# Patient Record
Sex: Female | Born: 1935 | Race: White | Hispanic: No | Smoking: Never smoker
Health system: Southern US, Community
[De-identification: ages and names within clinical notes are randomized; demographics above are authoritative.]

## PROBLEM LIST (undated history)

## (undated) DIAGNOSIS — R011 Cardiac murmur, unspecified: Secondary | ICD-10-CM

## (undated) DIAGNOSIS — M1990 Unspecified osteoarthritis, unspecified site: Secondary | ICD-10-CM

## (undated) DIAGNOSIS — D649 Anemia, unspecified: Secondary | ICD-10-CM

## (undated) DIAGNOSIS — N200 Calculus of kidney: Secondary | ICD-10-CM

## (undated) DIAGNOSIS — Z9289 Personal history of other medical treatment: Secondary | ICD-10-CM

## (undated) DIAGNOSIS — G43909 Migraine, unspecified, not intractable, without status migrainosus: Secondary | ICD-10-CM

## (undated) HISTORY — PX: BACK SURGERY: SHX140

## (undated) HISTORY — PX: KNEE ARTHROSCOPY: SHX127

## (undated) HISTORY — PX: DILATION AND CURETTAGE OF UTERUS: SHX78

## (undated) HISTORY — PX: KIDNEY CYST REMOVAL: SHX684

---
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
## 1963-01-19 HISTORY — PX: EXCISIONAL HEMORRHOIDECTOMY: SHX1541

## 1969-01-18 HISTORY — PX: TOTAL ABDOMINAL HYSTERECTOMY: SHX209

## 1981-01-18 HISTORY — PX: CHOLECYSTECTOMY: SHX55

## 1995-01-19 HISTORY — PX: PARTIAL NEPHRECTOMY: SHX414

## 1995-01-19 HISTORY — PX: THORACIC DISC SURGERY: SHX801

## 1999-01-19 HISTORY — PX: CATARACT EXTRACTION W/ INTRAOCULAR LENS  IMPLANT, BILATERAL: SHX1307

## 1999-08-17 ENCOUNTER — Encounter: Payer: Self-pay | Admitting: Internal Medicine

## 1999-08-17 ENCOUNTER — Encounter: Admission: RE | Admit: 1999-08-17 | Discharge: 1999-08-17 | Payer: Self-pay | Admitting: Internal Medicine

## 2000-08-17 ENCOUNTER — Encounter: Payer: Self-pay | Admitting: Internal Medicine

## 2000-08-17 ENCOUNTER — Encounter: Admission: RE | Admit: 2000-08-17 | Discharge: 2000-08-17 | Payer: Self-pay | Admitting: Internal Medicine

## 2000-10-20 ENCOUNTER — Ambulatory Visit (HOSPITAL_COMMUNITY): Admission: RE | Admit: 2000-10-20 | Discharge: 2000-10-20 | Payer: Self-pay | Admitting: Gastroenterology

## 2000-10-20 ENCOUNTER — Encounter (INDEPENDENT_AMBULATORY_CARE_PROVIDER_SITE_OTHER): Payer: Self-pay | Admitting: Specialist

## 2004-06-29 ENCOUNTER — Encounter: Admission: RE | Admit: 2004-06-29 | Discharge: 2004-06-29 | Payer: Self-pay | Admitting: Internal Medicine

## 2005-02-28 ENCOUNTER — Observation Stay (HOSPITAL_COMMUNITY): Admission: EM | Admit: 2005-02-28 | Discharge: 2005-03-01 | Payer: Self-pay | Admitting: Emergency Medicine

## 2008-12-09 ENCOUNTER — Inpatient Hospital Stay (HOSPITAL_COMMUNITY): Admission: EM | Admit: 2008-12-09 | Discharge: 2008-12-16 | Payer: Self-pay | Admitting: Emergency Medicine

## 2010-04-22 LAB — CBC
HCT: 26.8 % — ABNORMAL LOW (ref 36.0–46.0)
HCT: 29.8 % — ABNORMAL LOW (ref 36.0–46.0)
HCT: 38.3 % (ref 36.0–46.0)
Hemoglobin: 13.2 g/dL (ref 12.0–15.0)
Hemoglobin: 9.1 g/dL — ABNORMAL LOW (ref 12.0–15.0)
MCHC: 34 g/dL (ref 30.0–36.0)
MCV: 96.5 fL (ref 78.0–100.0)
MCV: 97.4 fL (ref 78.0–100.0)
Platelets: 176 10*3/uL (ref 150–400)
Platelets: 185 10*3/uL (ref 150–400)
Platelets: 227 10*3/uL (ref 150–400)
RBC: 2.76 MIL/uL — ABNORMAL LOW (ref 3.87–5.11)
RBC: 3.97 MIL/uL (ref 3.87–5.11)
RDW: 13.8 % (ref 11.5–15.5)
WBC: 10.2 10*3/uL (ref 4.0–10.5)
WBC: 17.2 10*3/uL — ABNORMAL HIGH (ref 4.0–10.5)
WBC: 8.8 10*3/uL (ref 4.0–10.5)

## 2010-04-22 LAB — URINALYSIS, ROUTINE W REFLEX MICROSCOPIC
Nitrite: NEGATIVE
Specific Gravity, Urine: 1.018 (ref 1.005–1.030)
Urobilinogen, UA: 1 mg/dL (ref 0.0–1.0)
pH: 6 (ref 5.0–8.0)

## 2010-04-22 LAB — DIFFERENTIAL
Basophils Absolute: 0 10*3/uL (ref 0.0–0.1)
Basophils Relative: 0 % (ref 0–1)
Monocytes Absolute: 1 10*3/uL (ref 0.1–1.0)
Neutro Abs: 15.3 10*3/uL — ABNORMAL HIGH (ref 1.7–7.7)

## 2010-04-22 LAB — COMPREHENSIVE METABOLIC PANEL
Albumin: 3.4 g/dL — ABNORMAL LOW (ref 3.5–5.2)
Alkaline Phosphatase: 83 U/L (ref 39–117)
BUN: 19 mg/dL (ref 6–23)
CO2: 26 mEq/L (ref 19–32)
Chloride: 105 mEq/L (ref 96–112)
GFR calc non Af Amer: >60 mL/min (ref >60)
Glucose, Bld: 113 mg/dL — ABNORMAL HIGH (ref 70–99)
Potassium: 4 mEq/L (ref 3.5–5.1)
Total Bilirubin: 0.6 mg/dL (ref 0.3–1.2)

## 2010-04-22 LAB — URINE MICROSCOPIC-ADD ON

## 2010-04-22 LAB — BASIC METABOLIC PANEL
BUN: 18 mg/dL (ref 6–23)
Creatinine, Ser: 0.77 mg/dL (ref 0.4–1.2)
GFR calc Af Amer: >60 mL/min (ref >60)
GFR calc non Af Amer: >60 mL/min (ref >60)
Potassium: 4 mEq/L (ref 3.5–5.1)

## 2010-04-22 LAB — URINE CULTURE

## 2010-06-05 NOTE — H&P (Signed)
NAMEAILED, DEFIBAUGH NO.:  [id number]   MEDICAL RECORD NO.:  [id number]          PATIENT TYPE:  EMS   LOCATION:  MAJO                         FACILITY:  [facility]   PHYSICIAN:  Thora Lance, M.D.  DATE OF BIRTH:  1919-08-25   DATE OF ADMISSION:  02/28/2005  DATE OF DISCHARGE:                                HISTORY & PHYSICAL   CHIEF COMPLAINT:  Chest pain.   HISTORY OF PRESENT ILLNESS:  This is an 77 year old white female who  presents with chest pain.  Today after church she sat down to lunch.  After  taking two bites she had the sudden onset of a severe 10/10 crushing  retrosternal chest pain which radiated to both shoulders and arms.  It was  associated with an inability to get a deep breath, nausea, but no vomiting.  She tried to drink some coffee during this time, but had a hard time getting  it down, felt like she was going to choke, but did not have any aspiration  symptoms.  She took aspirin x1 dose and then nitroglycerin sublingual x2  with no relief.  She called EMT and was given another nitroglycerin by EMT  again with no relief.  In the ER she was given a combination of morphine,  lorazepam, and a GI cocktail with improvement in her pain and now only  complaints of a mild pain in her left upper chest.  She denies any recent  exertional chest pain, shortness of breath, cough, indigestion, heartburn,  dysphagia, reflux of acid up into her throat, abdominal pain.  She had two  very similar pain episodes in the spring of 2006.  At least one of these  occurred while she was sleeping.  She was referred to Dr. Armanda Magic who  did the 2-D echocardiogram and then adenosine Cardiolite in June of last  year and she was told afterwards she had a stiff left heart.  She was placed  on metoprolol by Dr. Earl Gala but this caused a rash and diarrhea and was  discontinued in September of 2006.  She has had no recent exertional chest  pain, but does have a chronic  feeling of mild shortness of breath when she  walks.   PAST MEDICAL HISTORY:  1.  DJD of the knees.  2.  Back pain.   PAST SURGICAL HISTORY:  1.  Arthroscopic surgery of both knees.  2.  Left partial nephrectomy, Dr. Logan Bores.  3.  Right renal cyst drainage, Dr. Logan Bores.  4.  Back surgery.  5.  Cholecystectomy.  6.  TAH/USO.   ALLERGIES:  PENICILLIN, CELEBREX, BEXTRA, SULFA, METOPROLOL.   CURRENT MEDICATIONS:  Aspirin 325 mg p.o. daily.   FAMILY HISTORY:  Sister dementia.  Brother CAD in 63s.  Brother valvular  heart disease.  Brother possible septal defect and pacemaker, died age 98.   SOCIAL HISTORY:  Widowed 22 years.  Has four sons, one of whom lives next  door.  Nonsmoker.  Nondrinker.   REVIEW OF SYSTEMS:  As above.   PHYSICAL EXAMINATION:  GENERAL:  She appears  comfortable lying supine in the  ER.  VITAL SIGNS:  Blood pressure 166/74, heart rate 54, respirations 20, oxygen  saturation 97% on room air.  Temperature 97.  HEENT:  Eyes:  Pupils equal and respond to light.  Extraocular movements are  intact.  Ears:  TMs clear.  Oropharynx is clear.  NECK:  Supple.  No carotid bruits.  LUNGS:  Dry crackles at the bases.  HEART:  Regular rate and rhythm without murmurs, rubs, or gallops.  ABDOMEN:  Soft, nontender.  No mass or hepatosplenomegaly.  Normal bowel  sounds.  BREASTS:  Deferred.  PELVIC:  Deferred.  RECTAL:  Deferred.  EXTREMITIES:  No edema.  Normal bilateral femoral, dorsalis pedis, and  posterior tibial pulses.  NEUROLOGIC:  Nonfocal.   LABORATORIES:  Sodium 138, potassium 3.9, chloride 106, bicarbonate 19,  creatinine 0.8, glucose 143.  CK 1.6 and 2.5, troponin I less than 0.05 x2.  D-dimer 0.37 which is normal.   Chest x-ray shows a large hiatal hernia, chronic increased markings at the  bases.  EKG:  Sinus bradycardia and nonspecific T-wave changes.   ASSESSMENT:  Chest pain.  Rule out myocardial infarction.  Rule out ischemic  cause.  I suspect  esophageal reflux, esophageal spasm, or chest wall pain is  more likely.   PLAN:  Admit to telemetry.  Rule out MI.  Aspirin.  PPI.  Consider cardiac  catheterization.           ______________________________  Thora Lance, M.D.     JJG/MEDQ  D:  02/28/2005  T:  03/01/2005  Job:  [id number]

## 2010-06-05 NOTE — Discharge Summary (Signed)
Erika [name], Erika [name] NO.:  [id number]   MEDICAL RECORD NO.:  [id number]          PATIENT TYPE:  INP   LOCATION:  5524                         FACILITY:  [facility]   PHYSICIAN:  Theressa Millard, M.D.    DATE OF BIRTH:  21-Apr-1919   DATE OF ADMISSION:  02/28/2005  DATE OF DISCHARGE:  03/01/2005                                 DISCHARGE SUMMARY   ADMITTING DIAGNOSES:  Chest pain.   DISCHARGE DIAGNOSES:  Chest pain, probable esophageal spasm.   The patient is an 77 year old white female who was admitted with an episode  of crushing substernal chest pain that started when she was eating.  During  the pain she decided she was unable to swallow coffee but she also had a  feeling of shortness of breath and as though her breathing was cut off and  she decided to take a deep breath.  She came to the emergency department  after approximately an hour.  At home she had actually tried three  nitroglycerin without relief.  She was given more nitroglycerin and aspirin  en route and that did not relieve her.  In the emergency room she was given  morphine, a GI cocktail, and oxygen and she fell asleep.  When she awoke the  pain was pretty much gone.   HOSPITAL COURSE:  The patient was admitted on the basis of serial EKGs and  CKs and myocardial infarction was ruled out.  She had no further chest  discomfort even when she ate and when she ambulated.  She was therefore  discharged in improved condition.   In terms of etiology it is noted that her D-dimer was normal which  effectively rules out pulmonary embolism.  CKs were negative and EKGs were  fine which rules out a myocardial infarction.  She had a stress Cardiolite  test in June of 2006 after a similar episode of discomfort which would make  prolonged angina unlikely.  Finally, she does describe some dysphagia during  the episode, therefore making [place] think that the patient actually suffers  from esophageal spasm.  It is a little  unusual that nitroglycerin did not  relieve her discomfort but no other etiology is forthcoming at this point.   The patient will be discharged on Protonix 40 mg daily and aspirin 325 mg  daily.  She will call to make an appointment to see me in one month.  Her  diet is not restricted and her activities are not restricted.  She is  advised to call if she has recurring symptoms.      Theressa Millard, M.D.  Electronically Signed     JO/MEDQ  D:  03/01/2005  T:  03/02/2005  Job:  [id number]

## 2010-06-05 NOTE — Procedures (Signed)
[name]  Patient:    Erika [name], CRUTCHLEY Visit Number: [id number] MRN: [id number]          Service Type: Attending:  Verlin Grills, M.D. Dictated by:   Verlin Grills, M.D. Proc. Date: 10/20/00   CC:         [name], MD                           Procedure Report  PROCEDURE PERFORMED:  Esophagogastroduodenoscopy, small bowel biopsy and colonoscopy.  DATE OF BIRTH:  07-31-1919  ENDOSCOPIST:  Verlin Grills, M.D.  INDICATIONS FOR PROCEDURE:  The patient is a 77 year old female with unexplained iron deficiency anemia.  I discussed with [name] the complications associated with colonoscopy and polypectomy including a 15 per 1000 risk of bleeding and 4 per 1000 risk of colon perforation requiring surgical repair.  PREMEDICATION:  Versed 5 mg, fentanyl 50 mcg.  INSTRUMENT USED:  Olympus pediatric colonoscope.  DESCRIPTION OF PROCEDURE:  Esophagogastroduodenoscopy with small bowel biopsies.  After obtaining informed consent, [name] was placed in the left lateral decubitus position.  I administered intravenous fentanyl and intravenous versed to achieve conscious sedation for the procedure.  The patients blood pressure, oxygen saturations and cardiac rhythm were monitored throughout the procedure and documented in the medical record.  The Olympus pediatric colonoscope was passed through the posterior hypopharynx into the proximal esophagus without difficulty.  The hypopharynx, larynx and vocal cords appeared normal.  Esophagoscopy:  The proximal, mid and lower segments of the esophagus appeared normal.  Gastroscopy:  [name] has a large hiatal hernia.  There are no erosions at the diaphragmatic hiatus.  Retroflex view of the gastric cardia and fundus was normal.  The gastric body, antrum and pylorus appeared normal.  Duodenoscopy:  The duodenal bulb, mid-duodenum, distal duodenum and proximal jejunum  appeared normal.  Five biopsies were taken from the second--third portions of the duodenum to rule out celiac sprue.  ASSESSMENT: 1. Large hiatal hernia; otherwise normal esophagogastroduodenoscopy.  Small    bowel biopsies to rule out villous atrophy pending.  Proctocolonoscopy to the cecum:  Anal inspection normal.  Digital rectal examination normal.  The Olympus pediatric video colonoscope was introduced into the rectum and easily advanced to the cecum.  Colonic preparation for the exam today was satisfactory.  Rectum:  Normal.  Sigmoid colon and descending colon:  Extensive left colonic diverticulosis.  Splenic flexure:  Normal.  Transverse colon:  Normal.  Hepatic flexure:  Normal.  Ascending colon:  Normal.  Cecum and ileocecal valve:  Normal.  ASSESSMENT: 1. Extensive left colonic diverticulosis; otherwise normal proctocolonoscopy    to the cecum.  No endoscopic evidence for the presence of colorectal neoplasia. Dictated by:   Verlin Grills, M.D. Attending:  Verlin Grills, M.D. DD:  10/20/00 TD:  10/20/00 Job: [id number]

## 2013-10-09 ENCOUNTER — Emergency Department (HOSPITAL_COMMUNITY): Payer: Medicare Other

## 2013-10-09 ENCOUNTER — Encounter (HOSPITAL_COMMUNITY): Payer: Self-pay | Admitting: Emergency Medicine

## 2013-10-09 ENCOUNTER — Inpatient Hospital Stay (HOSPITAL_COMMUNITY)
Admission: EM | Admit: 2013-10-09 | Discharge: 2013-10-11 | DRG: 808 | Disposition: A | Discharge status: Home Health | Payer: Medicare Other | Attending: Internal Medicine | Admitting: Internal Medicine

## 2013-10-09 DIAGNOSIS — Z9849 Cataract extraction status, unspecified eye: Secondary | ICD-10-CM

## 2013-10-09 DIAGNOSIS — D539 Nutritional anemia, unspecified: Secondary | ICD-10-CM | POA: Diagnosis present

## 2013-10-09 DIAGNOSIS — Z9289 Personal history of other medical treatment: Secondary | ICD-10-CM

## 2013-10-09 DIAGNOSIS — R0602 Shortness of breath: Secondary | ICD-10-CM | POA: Diagnosis present

## 2013-10-09 DIAGNOSIS — Z79899 Other long term (current) drug therapy: Secondary | ICD-10-CM | POA: Diagnosis not present

## 2013-10-09 DIAGNOSIS — Z961 Presence of intraocular lens: Secondary | ICD-10-CM

## 2013-10-09 DIAGNOSIS — R0609 Other forms of dyspnea: Secondary | ICD-10-CM | POA: Diagnosis present

## 2013-10-09 DIAGNOSIS — D61818 Other pancytopenia: Secondary | ICD-10-CM | POA: Diagnosis present

## 2013-10-09 DIAGNOSIS — I509 Heart failure, unspecified: Secondary | ICD-10-CM | POA: Diagnosis present

## 2013-10-09 DIAGNOSIS — I5033 Acute on chronic diastolic (congestive) heart failure: Secondary | ICD-10-CM | POA: Diagnosis present

## 2013-10-09 DIAGNOSIS — D649 Anemia, unspecified: Secondary | ICD-10-CM | POA: Diagnosis present

## 2013-10-09 DIAGNOSIS — N390 Urinary tract infection, site not specified: Secondary | ICD-10-CM | POA: Diagnosis present

## 2013-10-09 DIAGNOSIS — R0989 Other specified symptoms and signs involving the circulatory and respiratory systems: Secondary | ICD-10-CM

## 2013-10-09 HISTORY — DX: Unspecified osteoarthritis, unspecified site: M19.90

## 2013-10-09 HISTORY — DX: Calculus of kidney: N20.0

## 2013-10-09 HISTORY — DX: Personal history of other medical treatment: Z92.89

## 2013-10-09 HISTORY — DX: Anemia, unspecified: D64.9

## 2013-10-09 HISTORY — DX: Migraine, unspecified, not intractable, without status migrainosus: G43.909

## 2013-10-09 HISTORY — DX: Cardiac murmur, unspecified: R01.1

## 2013-10-09 LAB — I-STAT CHEM 8, ED
BUN: 22 mg/dL (ref 6–23)
CALCIUM ION: 1.17 mmol/L (ref 1.13–1.30)
CHLORIDE: 101 meq/L (ref 96–112)
Creatinine, Ser: 0.9 mg/dL (ref 0.50–1.10)
GLUCOSE: 113 mg/dL — AB (ref 70–99)
HEMATOCRIT: 19 % — AB (ref 36.0–46.0)
HEMOGLOBIN: 6.5 g/dL — AB (ref 12.0–15.0)
Potassium: 4.4 mEq/L (ref 3.7–5.3)
Sodium: 136 mEq/L — ABNORMAL LOW (ref 137–147)
TCO2: 22 mmol/L (ref 0–100)

## 2013-10-09 LAB — PRO B NATRIURETIC PEPTIDE: Pro B Natriuretic peptide (BNP): 1194 pg/mL — ABNORMAL HIGH (ref 0–450)

## 2013-10-09 LAB — CBC WITH DIFFERENTIAL/PLATELET
BASOS ABS: 0 10*3/uL (ref 0.0–0.1)
BASOS PCT: 0 % (ref 0–1)
EOS ABS: 0 10*3/uL (ref 0.0–0.7)
Eosinophils Relative: 1 % (ref 0–5)
HCT: 17.6 % — ABNORMAL LOW (ref 36.0–46.0)
HEMOGLOBIN: 5.9 g/dL — AB (ref 12.0–15.0)
LYMPHS PCT: 59 % — AB (ref 12–46)
Lymphs Abs: 1.2 10*3/uL (ref 0.7–4.0)
MCH: 39.9 pg — AB (ref 26.0–34.0)
MCHC: 33.5 g/dL (ref 30.0–36.0)
MCV: 118.9 fL — ABNORMAL HIGH (ref 78.0–100.0)
MONO ABS: 0.2 10*3/uL (ref 0.1–1.0)
Monocytes Relative: 8 % (ref 3–12)
NEUTROS ABS: 0.6 10*3/uL — AB (ref 1.7–7.7)
NEUTROS PCT: 32 % — AB (ref 43–77)
Platelets: 150 10*3/uL (ref 150–400)
RBC: 1.48 MIL/uL — ABNORMAL LOW (ref 3.87–5.11)
RDW: 16.7 % — AB (ref 11.5–15.5)
WBC: 2 10*3/uL — ABNORMAL LOW (ref 4.0–10.5)

## 2013-10-09 LAB — URINALYSIS, ROUTINE W REFLEX MICROSCOPIC
BILIRUBIN URINE: NEGATIVE
Glucose, UA: NEGATIVE mg/dL
Hgb urine dipstick: NEGATIVE
KETONES UR: NEGATIVE mg/dL
NITRITE: NEGATIVE
PH: 7 (ref 5.0–8.0)
PROTEIN: NEGATIVE mg/dL
Specific Gravity, Urine: 1.014 (ref 1.005–1.030)
UROBILINOGEN UA: 0.2 mg/dL (ref 0.0–1.0)

## 2013-10-09 LAB — POC OCCULT BLOOD, ED: FECAL OCCULT BLD: NEGATIVE

## 2013-10-09 LAB — I-STAT TROPONIN, ED: Troponin i, poc: 0.01 ng/mL (ref 0.00–0.08)

## 2013-10-09 LAB — URINE MICROSCOPIC-ADD ON

## 2013-10-09 LAB — PREPARE RBC (CROSSMATCH)

## 2013-10-09 LAB — ABO/RH: ABO/RH(D): A POS

## 2013-10-09 LAB — D-DIMER, QUANTITATIVE (NOT AT ARMC): D DIMER QUANT: 1.61 ug{FEU}/mL — AB (ref 0.00–0.48)

## 2013-10-09 MED ORDER — ACETAMINOPHEN 325 MG PO TABS: 650.0000 mg | ORAL_TABLET | Freq: Four times a day (QID) | ORAL | Status: DC | PRN

## 2013-10-09 MED ORDER — FUROSEMIDE 10 MG/ML IJ SOLN
20.0000 mg | Freq: Once | INTRAMUSCULAR | Status: AC
  Administered 2013-10-09: 20 mg via INTRAVENOUS
  Filled 2013-10-09: qty 2

## 2013-10-09 MED ORDER — SODIUM CHLORIDE 0.9 % IJ SOLN
3.0000 mL | Freq: Two times a day (BID) | INTRAMUSCULAR | Status: DC
  Administered 2013-10-09 – 2013-10-10 (×2): 3 mL via INTRAVENOUS

## 2013-10-09 MED ORDER — SODIUM CHLORIDE 0.9 % IV SOLN
Freq: Once | INTRAVENOUS | Status: AC
  Administered 2013-10-09: 15:00:00 via INTRAVENOUS

## 2013-10-09 MED ORDER — ONDANSETRON HCL 4 MG/2ML IJ SOLN: 4.0000 mg | Freq: Four times a day (QID) | INTRAMUSCULAR | Status: DC | PRN

## 2013-10-09 MED ORDER — DEXTROSE 5 % IV SOLN
1.0000 g | INTRAVENOUS | Status: DC
  Administered 2013-10-10 (×2): 1 g via INTRAVENOUS
  Filled 2013-10-09 (×3): qty 10

## 2013-10-09 MED ORDER — FUROSEMIDE 10 MG/ML IJ SOLN
20.0000 mg | Freq: Two times a day (BID) | INTRAMUSCULAR | Status: DC
  Administered 2013-10-09 – 2013-10-10 (×2): 20 mg via INTRAVENOUS
  Filled 2013-10-09 (×4): qty 2

## 2013-10-09 MED ORDER — ACETAMINOPHEN 650 MG RE SUPP: 650.0000 mg | Freq: Four times a day (QID) | RECTAL | Status: DC | PRN

## 2013-10-09 MED ORDER — LOSARTAN POTASSIUM 50 MG PO TABS
50.0000 mg | ORAL_TABLET | Freq: Every day | ORAL | Status: DC
  Administered 2013-10-09 – 2013-10-11 (×3): 50 mg via ORAL
  Filled 2013-10-09 (×3): qty 1

## 2013-10-09 MED ORDER — VITAMIN D3 25 MCG (1000 UNIT) PO TABS
1000.0000 [IU] | ORAL_TABLET | Freq: Every day | ORAL | Status: DC
  Administered 2013-10-10 – 2013-10-11 (×2): 1000 [IU] via ORAL
  Filled 2013-10-09 (×2): qty 1

## 2013-10-09 MED ORDER — VITAMIN D-3 25 MCG (1000 UT) PO CAPS: 1.0000 | ORAL_CAPSULE | Freq: Every day | ORAL | Status: DC

## 2013-10-09 MED ORDER — ASPIRIN 81 MG PO CHEW
324.0000 mg | CHEWABLE_TABLET | Freq: Once | ORAL | Status: AC
  Administered 2013-10-09: 324 mg via ORAL
  Filled 2013-10-09: qty 4

## 2013-10-09 MED ORDER — ONDANSETRON HCL 4 MG PO TABS: 4.0000 mg | ORAL_TABLET | Freq: Four times a day (QID) | ORAL | Status: DC | PRN

## 2013-10-09 MED ORDER — DEXTROSE 5 % IV SOLN: 1.0000 g | INTRAVENOUS | Status: DC

## 2013-10-09 MED ORDER — ADULT MULTIVITAMIN W/MINERALS CH
1.0000 | ORAL_TABLET | Freq: Every day | ORAL | Status: DC
  Administered 2013-10-09 – 2013-10-11 (×3): 1 via ORAL
  Filled 2013-10-09 (×3): qty 1

## 2013-10-09 NOTE — ED Notes (Signed)
Pt. Reports "feeling ill" since august after pneumonia shot. States past 2 weeks has had SOB, is being treated for a UTI. Sent here from PCP due to irregular heart beat. Denies chest pain. Alert and oriented x4.

## 2013-10-09 NOTE — H&P (Signed)
[facility] History and Physical  Erika [name]:[id number] DOB: 31-Mar-1919 DOA: 10/09/2013  Referring physician:  PCP: Valaria Good, MD  Specialists:   Chief Complaint: SOB, DOE  HPI: Erika [name] is a 80 y.o. female with no significant medical history had intermittent SOB, DOE for 2 weeks associated with L shoulder, muscle pain. Pt also developed dysuria and Dx with UTI, started on bactrim two days prior to admission;  Since then , she reports worsening SOB, DOE, associated with PND, orthopnea and found to have severe anemia with Hg of 5.9; Pt denies any acut e bleeding, no nausea, vomiting or diarrhea, no hematuria, no hematochezia, no hemoptysis;   -Hospitalist called for evaluation symptomatic anemia   Review of Systems: The patient denies anorexia, fever, weight loss,, vision loss, decreased hearing, hoarseness, chest pain, syncope, dyspnea on exertion, peripheral edema, balance deficits, hemoptysis, abdominal pain, melena, hematochezia, severe indigestion/heartburn, hematuria, incontinence, genital sores, muscle weakness, suspicious skin lesions, transient blindness, difficulty walking, depression, unusual weight change, abnormal bleeding, enlarged lymph nodes, angioedema, and breast masses.    History reviewed. No pertinent past medical history. No h/o CAD, no CHF History reviewed. No pertinent past surgical history. Denies tobacco use, no etoh use  Social History:  reports that she has never smoked. She does not have any smokeless tobacco history on file. She reports that she does not drink alcohol or use illicit drugs. Home  where does patient live--home, ALF, SNF? and with whom if at home? Yes;  Can patient participate in ADLs?  Allergies  Allergen Reactions  . Penicillins Other (See Comments)    unknown  . Sulfa Antibiotics Other (See Comments)    unknown    History reviewed. No pertinent family history. denies h/o CAD (be sure to complete)  Prior to  Admission medications   Medication Sig Start Date End Date Taking? Authorizing Provider  Ascorbic Acid (VITAMIN C) 1000 MG tablet Take 1,000 mg by mouth daily.   Yes Historical Provider, MD  b complex vitamins tablet Take 1 tablet by mouth daily.   Yes Historical Provider, MD  Cholecalciferol (VITAMIN D-3) 1000 UNITS CAPS Take 1 capsule by mouth daily.   Yes Historical Provider, MD  losartan (COZAAR) 50 MG tablet Take 50 mg by mouth daily.  10/05/13  Yes Historical Provider, MD  Multiple Vitamin (MULTIVITAMIN WITH MINERALS) TABS tablet Take 1 tablet by mouth daily.   Yes Historical Provider, MD  sulfamethoxazole-trimethoprim (BACTRIM DS) 800-160 MG per tablet Take 1 tablet by mouth 2 (two) times daily.  10/07/13  Yes Historical Provider, MD   Physical Exam: Filed Vitals:   10/09/13 1430  BP: 132/61  Pulse: 89  Temp:   Resp: 18     General:  aler t  Eyes: eom-i  ENT: no oral ulcers   Neck: supple mild JVD   Cardiovascular: D1,S9 systolic mr   Respiratory: few crackle sin LL  Abdomen: soft, mild tender in the bladder area, no rebound; + BS   Skin: no rash   Musculoskeletal: mild edema   Psychiatric: no hallucinations   Neurologic: CN 2-12 intact, motor 5/5 BL  Labs on Admission:  Basic Metabolic Panel:  Recent Labs Lab 10/09/13 1315  NA 136*  K 4.4  CL 101  GLUCOSE 113*  BUN 22  CREATININE 0.90   Liver Function Tests: No results found for this basename: AST, ALT, ALKPHOS, BILITOT, PROT, ALBUMIN,  in the last 168 hours No results found for this basename: LIPASE, AMYLASE,  in the last 168 hours No results found for this basename: AMMONIA,  in the last 168 hours CBC:  Recent Labs Lab 10/09/13 1249 10/09/13 1315  WBC 2.0*  --   NEUTROABS 0.6*  --   HGB 5.9* 6.5*  HCT 17.6* 19.0*  MCV 118.9*  --   PLT 150  --    Cardiac Enzymes: No results found for this basename: CKTOTAL, CKMB, CKMBINDEX, TROPONINI,  in the last 168 hours  BNP (last 3  results)  Recent Labs  10/09/13 1251  PROBNP 1194.0*   CBG: No results found for this basename: GLUCAP,  in the last 168 hours  Radiological Exams on Admission: Ct Abdomen Pelvis Wo Contrast  10/09/2013   CLINICAL DATA:  Epigastric pain.  EXAM: CT ABDOMEN AND PELVIS WITHOUT CONTRAST  TECHNIQUE: Multidetector CT imaging of the abdomen and pelvis was performed following the standard protocol without IV contrast.  COMPARISON:  None.  FINDINGS: Multilevel degenerative disc disease is noted in the lumbar spine. Old L1 compression fracture is noted. Visualized lung bases appear normal. Large hiatal hernia is noted.  Left hepatic cyst is noted. Status post cholecystectomy. The spleen and pancreas appear normal. Simple right renal cysts are noted. Mild right renal atrophy is noted. Cortical scarring of upper pole of left kidney is noted. No hydronephrosis or renal obstruction is noted. Atherosclerotic calcifications of abdominal aorta are noted without aneurysm formation. There is no evidence of bowel obstruction. Stool is noted in the right colon. Minimal sigmoid diverticulosis is noted without inflammation urinary bladder appears normal. 3.9 cm right ovarian cyst is noted. Simple right renal cysts are noted.  IMPRESSION: Large hiatal hernia is noted.  Bilateral nephrolithiasis is noted without hydronephrosis or renal obstruction.  Mild sigmoid diverticulosis is noted without inflammation.  3.9 cm probable simple right ovarian cyst is noted. Followup ultrasound in 1 year is recommended to ensure stability.   Electronically Signed   By: Sabino Dick M.D.   On: 10/09/2013 14:04   Dg Chest 2 View  10/09/2013   CLINICAL DATA:  Shortness of breath and dizziness  EXAM: CHEST  2 VIEW  COMPARISON:  February 28, 2005  FINDINGS: There is no edema or consolidation. Heart is borderline enlarged with pulmonary vascularity within normal limits. Most of the stomach is above the diaphragm. No pneumothorax. There is evidence  of prior fracture involving the proximal right humerus.  IMPRESSION: Most of the stomach is above the diaphragm. No edema or consolidation. Heart borderline enlarged. Prior trauma proximal right humerus.   Electronically Signed   By: Lowella Grip M.D.   On: 10/09/2013 13:51    EKG: Independently reviewed.   Assessment/Plan Principal Problem:   Symptomatic anemia Active Problems:   SOB (shortness of breath)   DOE (dyspnea on exertion)   UTI (lower urinary tract infection)   80 y.o. female with no significant medical history had intermittent SOB, DOE for 2 weeks, recent  Dx with UTI, on bactrim found to have severe anemia with Hg of 5.9, with possible CHF   1. Acute symptomatic anemia of unclear etiology; no s/s of acute bleeding; occult blood test negative  -TF sing 2 units in ED; monitor Hg; TF prn  -? Underlying MDS vs acute side effect of bactrim; no recent CBC to compare  -check LDH, LFTs, haptoglobin r/o hemolysis, high MCV; check iron profile, B12,folate; may need BM biopsy; f/u peripheral smear  2. Acute CHF likely due to Anemia;  -start gentle IV diuresis; obtain echo;  f/u trop, ECG; daily weight, I/O';  3. Recent UTI, d/c bactrim; start IV ceftriaxone; f/u cultures   DVT prophylaxis; hold heparin until r/o bleeding; cont SCD    None;  if consultant consulted, please document name and whether formally or informally consulted  Code Status: full (must indicate code status--if unknown or must be presumed, indicate so) Family Communication:  D/w patient, his sons at the bedside (indicate person spoken with, if applicable, with phone number if by telephone) Disposition Plan: home pend clinical improvement  (indicate anticipated LOS)  Time spent: >35 minutes   [place] [place] [facility] Pager [phone]  If 7PM-7AM, please contact night-coverage [email or web address] Password [facility] 10/09/2013, 4:02 PM

## 2013-10-09 NOTE — Progress Notes (Signed)
NURSING PROGRESS NOTE  Erika [name] [id number] Admission Data: 10/09/2013 5:52 PM Attending Provider: Kinnie Feil, MD MYT:[id number],LIDCV  M, MD Code Status: full   Erika [name] is a 80 y.o. female patient admitted from ED:  -No acute distress noted.  -No complaints of shortness of breath.  -No complaints of chest pain.   Cardiac Monitoring: Box #[id number] in place. Cardiac monitor yields:normal sinus rhythm.  Blood pressure 154/71, pulse 92, temperature 98 F (36.7 C), temperature source Oral, resp. rate 16, height 5\' 3"  (1.6 m), weight 70.761 kg (156 lb), SpO2 96.00%.   IV Fluids:  IV in place, occlusive dsg intact without redness, IV cath antecubital right and Left, condition patent and no redness and left, condition patent and no redness NSL.   Allergies:  Sulfa antibiotics and Penicillins  Past Medical History:   has no past medical history on file.  Past Surgical History:   has no past surgical history on file.  Social History:   reports that she has never smoked. She does not have any smokeless tobacco history on file. She reports that she does not drink alcohol or use illicit drugs.  Skin: pale  Patient/Family orientated to room. Information packet given to patient/family. Admission inpatient armband information verified with patient/family to include name and date of birth and placed on patient arm. Side rails up x 2, fall assessment and education completed with patient/family. Patient/family able to verbalize understanding of risk associated with falls and verbalized understanding to call for assistance before getting out of bed. Call light within reach. Patient/family able to voice and demonstrate understanding of unit orientation instructions.    Will continue to evaluate and treat per MD orders.

## 2013-10-09 NOTE — Progress Notes (Signed)
rn called for report

## 2013-10-09 NOTE — Progress Notes (Addendum)
ANTIBIOTIC CONSULT NOTE - INITIAL  Pharmacy Consult for Ceftriaxone Indication: UTI  Allergies  Allergen Reactions  . Penicillins Other (See Comments)    unknown  . Sulfa Antibiotics Other (See Comments)    unknown    Patient Measurements: Actual Body Weight: 70.8 kg   Vital Signs: Temp: 99.1 F (37.3 C) (09/22 1225) Temp src: Oral (09/22 1225) BP: 132/61 mmHg (09/22 1430) Pulse Rate: 89 (09/22 1430) Intake/Output from previous day:   Intake/Output from this shift:    Labs:  Recent Labs  10/09/13 1249 10/09/13 1315  WBC 2.0*  --   HGB 5.9* 6.5*  PLT 150  --   CREATININE  --  0.90   CrCl is unknown because there is no height on file for the current visit. No results found for this basename: VANCOTROUGH, VANCOPEAK, VANCORANDOM, GENTTROUGH, GENTPEAK, GENTRANDOM, TOBRATROUGH, TOBRAPEAK, TOBRARND, AMIKACINPEAK, AMIKACINTROU, AMIKACIN,  in the last 72 hours   Microbiology: No results found for this or any previous visit (from the past 720 hour(s)).  Medical History: History reviewed. No pertinent past medical history.  Medications:   (Not in a hospital admission) Scheduled:  . furosemide  20 mg Intravenous Once   Infusions:   Assessment: 80yo female presents with dysuria and diagnosis of UTI 2 days ago, which she was started on Bactrim. Pharmacy is consulted to dose ceftriaxone for UTI. Pt is afebrile, WBC 2.0, sCr 0.90. UA reveals trace leukocytes, 3-6 WBC, and rare bacteria.  Pt has allergy to PCNs that she describes as a rash developed towards the latter stages of a 6 week treatment course in 1959.  Goal of Therapy:  Resolution of infection  Plan:  Start ceftriaxone 1g IV q24h Follow up culture results Pharmacy will sign off for now. Please re-consult if needed.  Andrey Cota. Diona Foley, PharmD Clinical Pharmacist Pager [phone] 10/09/2013,4:44 PM

## 2013-10-09 NOTE — ED Notes (Signed)
Blood bank informed this RN that blood is ready for transfusion

## 2013-10-09 NOTE — ED Notes (Signed)
Pt c/o irregular HR and SOB x several days; pt sts not felt right since getting flu shot in August; pt sent by PCP for further eval

## 2013-10-09 NOTE — ED Notes (Signed)
Chem 8 results given to Dr. Lenna Sciara

## 2013-10-09 NOTE — ED Notes (Signed)
Amanda Cockayne, MD with rectal examination

## 2013-10-09 NOTE — ED Provider Notes (Signed)
CSN: [id number]     Arrival date & time 10/09/13  1121 History   First MD Initiated Contact with Patient 10/09/13 1154     Chief Complaint  Patient presents with  . Irregular Heart Beat  . Shortness of Breath     (Consider location/radiation/quality/duration/timing/severity/associated sxs/prior Treatment) HPI Point of left anterior chest pain, pleuritic in nature intermittent onset 2 days ago. Patient can also complains of generalized weakness and shortness of breath. She denies nausea vomiting. She was seen at her primary care physician's office this morning, sent here for further evaluation and possible CHF History reviewed. No pertinent past medical history. past medical history negative History reviewed. No pertinent past surgical history. History reviewed. No pertinent family history. History  Substance Use Topics  . Smoking status: Never Smoker   . Smokeless tobacco: Not on file  . Alcohol Use: No   OB History   Grav Para Term Preterm Abortions TAB SAB Ect Mult Living                 Review of Systems  Constitutional: Negative.   HENT: Negative.   Respiratory: Positive for shortness of breath.   Cardiovascular: Positive for chest pain.  Gastrointestinal: Negative.   Genitourinary: Positive for dysuria.  Musculoskeletal: Negative.   Skin: Negative.   Neurological: Negative.   Psychiatric/Behavioral: Negative.   All other systems reviewed and are negative.     Allergies  Penicillins  Home Medications   Prior to Admission medications   Not on File   BP 124/59  Pulse 91  Temp(Src) 99.1 F (37.3 C) (Oral)  Resp 13  SpO2 100% Physical Exam  Nursing note and vitals reviewed. Constitutional: She is oriented to person, place, and time. She appears well-developed and well-nourished.  HENT:  Head: Normocephalic and atraumatic.  Eyes: Pupils are equal, round, and reactive to light.  Conjunctiva pale  Neck: Neck supple. No tracheal deviation present. No  thyromegaly present.  Cardiovascular: Normal rate and regular rhythm.   Murmur heard. 2/6 systolic murmur left sternal border  Pulmonary/Chest: Effort normal and breath sounds normal.  Abdominal: Soft. Bowel sounds are normal. She exhibits no distension. There is tenderness.  Mild periumbilical tenderness  Genitourinary: Guaiac negative stool.  Normal tone brown stool  Musculoskeletal: Normal range of motion. She exhibits no edema and no tenderness.  Neurological: She is alert and oriented to person, place, and time. Coordination normal.  Skin: Skin is warm and dry. No rash noted.  Psychiatric: She has a normal mood and affect.    ED Course  Procedures (including critical care time) Labs Review Labs Reviewed  URINALYSIS, ROUTINE W REFLEX MICROSCOPIC  CBC WITH DIFFERENTIAL  D-DIMER, QUANTITATIVE  POC OCCULT BLOOD, ED  I-STAT CHEM 8, ED  I-STAT TROPOININ, ED    Imaging Review No results found.   EKG Interpretation   Date/Time:  Tuesday October 09 2013 11:28:36 EDT Ventricular Rate:  117 PR Interval:  88 QRS Duration: 112 QT Interval:  352 QTC Calculation: 491 R Axis:   164 Text Interpretation:  Sinus tachycardia with short PR with Premature  supraventricular complexes Right axis deviation Septal infarct , age  undetermined T wave abnormality, consider inferior ischemia Abnormal ECG  SINCE LAST TRACING HEART RATE HAS INCREASED Confirmed by Winfred Leeds  MD,  Elohim Brune [phone]) on 10/09/2013 12:10:34 PM        medical decision-making:   1:20 PM patient i-STAT returned showing hemoglobin of 6.5. She was type and crossmatched to transfuse with 2 units  packed red cells immediately. CT scan of the abdomen ordered emergently as concern for active internal bleeding  Results for orders placed during the hospital encounter of 10/09/13  URINALYSIS, ROUTINE W REFLEX MICROSCOPIC      Result Value Ref Range   Color, Urine YELLOW  YELLOW   APPearance CLEAR  CLEAR   Specific Gravity,  Urine 1.014  1.005 - 1.030   pH 7.0  5.0 - 8.0   Glucose, UA NEGATIVE  NEGATIVE mg/dL   Hgb urine dipstick NEGATIVE  NEGATIVE   Bilirubin Urine NEGATIVE  NEGATIVE   Ketones, ur NEGATIVE  NEGATIVE mg/dL   Protein, ur NEGATIVE  NEGATIVE mg/dL   Urobilinogen, UA 0.2  0.0 - 1.0 mg/dL   Nitrite NEGATIVE  NEGATIVE   Leukocytes, UA TRACE (*) NEGATIVE  CBC WITH DIFFERENTIAL      Result Value Ref Range   WBC 2.0 (*) 4.0 - 10.5 K/uL   RBC 1.48 (*) 3.87 - 5.11 MIL/uL   Hemoglobin 5.9 (*) 12.0 - 15.0 g/dL   HCT 17.6 (*) 36.0 - 46.0 %   MCV 118.9 (*) 78.0 - 100.0 fL   MCH 39.9 (*) 26.0 - 34.0 pg   MCHC 33.5  30.0 - 36.0 g/dL   RDW 16.7 (*) 11.5 - 15.5 %   Platelets 150  150 - 400 K/uL   Neutrophils Relative % 32 (*) 43 - 77 %   Lymphocytes Relative 59 (*) 12 - 46 %   Monocytes Relative 8  3 - 12 %   Eosinophils Relative 1  0 - 5 %   Basophils Relative 0  0 - 1 %   Neutro Abs 0.6 (*) 1.7 - 7.7 K/uL   Lymphs Abs 1.2  0.7 - 4.0 K/uL   Monocytes Absolute 0.2  0.1 - 1.0 K/uL   Eosinophils Absolute 0.0  0.0 - 0.7 K/uL   Basophils Absolute 0.0  0.0 - 0.1 K/uL   RBC Morphology BASOPHILIC STIPPLING    D-DIMER, QUANTITATIVE      Result Value Ref Range   D-Dimer, Quant 1.61 (*) 0.00 - 0.48 ug/mL-FEU  PRO B NATRIURETIC PEPTIDE      Result Value Ref Range   Pro B Natriuretic peptide (BNP) 1194.0 (*) 0 - 450 pg/mL  URINE MICROSCOPIC-ADD ON      Result Value Ref Range   Squamous Epithelial / LPF FEW (*) RARE   WBC, UA 3-6  <3 WBC/hpf   RBC / HPF 0-2  <3 RBC/hpf   Bacteria, UA RARE  RARE  POC OCCULT BLOOD, ED      Result Value Ref Range   Fecal Occult Bld NEGATIVE  NEGATIVE  I-STAT CHEM 8, ED      Result Value Ref Range   Sodium 136 (*) 137 - 147 mEq/L   Potassium 4.4  3.7 - 5.3 mEq/L   Chloride 101  96 - 112 mEq/L   BUN 22  6 - 23 mg/dL   Creatinine, Ser 0.90  0.50 - 1.10 mg/dL   Glucose, Bld 113 (*) 70 - 99 mg/dL   Calcium, Ion 1.17  1.13 - 1.30 mmol/L   TCO2 22  0 - 100 mmol/L    Hemoglobin 6.5 (*) 12.0 - 15.0 g/dL   HCT 19.0 (*) 36.0 - 46.0 %   Comment NOTIFIED PHYSICIAN    I-STAT TROPOININ, ED      Result Value Ref Range   Troponin i, poc 0.01  0.00 - 0.08 ng/mL   Comment 3  PREPARE RBC (CROSSMATCH)      Result Value Ref Range   Order Confirmation ORDER PROCESSED BY BLOOD BANK     Ct Abdomen Pelvis Wo Contrast  10/09/2013   CLINICAL DATA:  Epigastric pain.  EXAM: CT ABDOMEN AND PELVIS WITHOUT CONTRAST  TECHNIQUE: Multidetector CT imaging of the abdomen and pelvis was performed following the standard protocol without IV contrast.  COMPARISON:  None.  FINDINGS: Multilevel degenerative disc disease is noted in the lumbar spine. Old L1 compression fracture is noted. Visualized lung bases appear normal. Large hiatal hernia is noted.  Left hepatic cyst is noted. Status post cholecystectomy. The spleen and pancreas appear normal. Simple right renal cysts are noted. Mild right renal atrophy is noted. Cortical scarring of upper pole of left kidney is noted. No hydronephrosis or renal obstruction is noted. Atherosclerotic calcifications of abdominal aorta are noted without aneurysm formation. There is no evidence of bowel obstruction. Stool is noted in the right colon. Minimal sigmoid diverticulosis is noted without inflammation urinary bladder appears normal. 3.9 cm right ovarian cyst is noted. Simple right renal cysts are noted.  IMPRESSION: Large hiatal hernia is noted.  Bilateral nephrolithiasis is noted without hydronephrosis or renal obstruction.  Mild sigmoid diverticulosis is noted without inflammation.  3.9 cm probable simple right ovarian cyst is noted. Followup ultrasound in 1 year is recommended to ensure stability.   Electronically Signed   By: Sabino Dick M.D.   On: 10/09/2013 14:04   Dg Chest 2 View  10/09/2013   CLINICAL DATA:  Shortness of breath and dizziness  EXAM: CHEST  2 VIEW  COMPARISON:  February 28, 2005  FINDINGS: There is no edema or consolidation.  Heart is borderline enlarged with pulmonary vascularity within normal limits. Most of the stomach is above the diaphragm. No pneumothorax. There is evidence of prior fracture involving the proximal right humerus.  IMPRESSION: Most of the stomach is above the diaphragm. No edema or consolidation. Heart borderline enlarged. Prior trauma proximal right humerus.   Electronically Signed   By: Lowella Grip M.D.   On: 10/09/2013 13:51    MDM  The patient having symptomatic anemia. She requires urgent transfusion. Spoke with [email or web address] Final diagnoses:  None   plan admit to telemetry Transfuse Diagnosis symptomatic anemia    Orlie Dakin, MD 10/09/13 1458

## 2013-10-10 DIAGNOSIS — I509 Heart failure, unspecified: Secondary | ICD-10-CM

## 2013-10-10 DIAGNOSIS — I379 Nonrheumatic pulmonary valve disorder, unspecified: Secondary | ICD-10-CM

## 2013-10-10 DIAGNOSIS — D61818 Other pancytopenia: Principal | ICD-10-CM

## 2013-10-10 DIAGNOSIS — D539 Nutritional anemia, unspecified: Secondary | ICD-10-CM

## 2013-10-10 LAB — COMPREHENSIVE METABOLIC PANEL
ALT: 11 U/L (ref 0–35)
ANION GAP: 13 (ref 5–15)
AST: 18 U/L (ref 0–37)
Albumin: 3.3 g/dL — ABNORMAL LOW (ref 3.5–5.2)
Alkaline Phosphatase: 69 U/L (ref 39–117)
BILIRUBIN TOTAL: 1 mg/dL (ref 0.3–1.2)
BUN: 21 mg/dL (ref 6–23)
CHLORIDE: 98 meq/L (ref 96–112)
CO2: 25 meq/L (ref 19–32)
CREATININE: 0.9 mg/dL (ref 0.50–1.10)
Calcium: 9 mg/dL (ref 8.4–10.5)
GFR, EST AFRICAN AMERICAN: 62 mL/min — AB (ref >90)
GFR, EST NON AFRICAN AMERICAN: 54 mL/min — AB (ref >90)
GLUCOSE: 106 mg/dL — AB (ref 70–99)
Potassium: 3.7 mEq/L (ref 3.7–5.3)
Sodium: 136 mEq/L — ABNORMAL LOW (ref 137–147)
Total Protein: 6.6 g/dL (ref 6.0–8.3)

## 2013-10-10 LAB — TROPONIN I
Troponin I: <0.3 ng/mL (ref <0.30)
Troponin I: <0.3 ng/mL (ref <0.30)
Troponin I: <0.3 ng/mL (ref <0.30)

## 2013-10-10 LAB — TYPE AND SCREEN
ABO/RH(D): A POS
Antibody Screen: NEGATIVE
UNIT DIVISION: 0
Unit division: 0

## 2013-10-10 LAB — IRON AND TIBC: Iron: 236 ug/dL — ABNORMAL HIGH (ref 42–135)

## 2013-10-10 LAB — CBC
HCT: 24 % — ABNORMAL LOW (ref 36.0–46.0)
Hemoglobin: 8.4 g/dL — ABNORMAL LOW (ref 12.0–15.0)
MCH: 35.7 pg — ABNORMAL HIGH (ref 26.0–34.0)
MCHC: 35 g/dL (ref 30.0–36.0)
MCV: 102.1 fL — ABNORMAL HIGH (ref 78.0–100.0)
PLATELETS: 144 10*3/uL — AB (ref 150–400)
RBC: 2.35 MIL/uL — ABNORMAL LOW (ref 3.87–5.11)
WBC: 2.6 10*3/uL — ABNORMAL LOW (ref 4.0–10.5)

## 2013-10-10 LAB — HAPTOGLOBIN: HAPTOGLOBIN: 176 mg/dL (ref 45–215)

## 2013-10-10 LAB — FERRITIN: FERRITIN: 348 ng/mL — AB (ref 10–291)

## 2013-10-10 LAB — TSH: TSH: 3.48 u[IU]/mL (ref 0.350–4.500)

## 2013-10-10 LAB — PATHOLOGIST SMEAR REVIEW

## 2013-10-10 LAB — LACTATE DEHYDROGENASE: LDH: 225 U/L (ref 94–250)

## 2013-10-10 LAB — FOLATE RBC: RBC Folate: 1566 ng/mL — ABNORMAL HIGH (ref >280)

## 2013-10-10 LAB — SAVE SMEAR

## 2013-10-10 LAB — VITAMIN B12: Vitamin B-12: 1293 pg/mL — ABNORMAL HIGH (ref 211–911)

## 2013-10-10 MED ORDER — POTASSIUM CHLORIDE CRYS ER 20 MEQ PO TBCR
20.0000 meq | EXTENDED_RELEASE_TABLET | Freq: Every day | ORAL | Status: DC
  Administered 2013-10-10 – 2013-10-11 (×2): 20 meq via ORAL
  Filled 2013-10-10 (×2): qty 1

## 2013-10-10 MED ORDER — SENNOSIDES-DOCUSATE SODIUM 8.6-50 MG PO TABS
1.0000 | ORAL_TABLET | Freq: Once | ORAL | Status: AC
  Administered 2013-10-10: 1 via ORAL
  Filled 2013-10-10: qty 1

## 2013-10-10 MED ORDER — DOCUSATE SODIUM 100 MG PO CAPS
100.0000 mg | ORAL_CAPSULE | Freq: Two times a day (BID) | ORAL | Status: DC
Start: 2013-10-10 — End: 2013-10-11
  Administered 2013-10-10 – 2013-10-11 (×3): 100 mg via ORAL
  Filled 2013-10-10 (×4): qty 1

## 2013-10-10 NOTE — Progress Notes (Signed)
Utilization review completed.  

## 2013-10-10 NOTE — Consult Note (Signed)
[facility] NOTE  Patient Care Team: Valaria Good, MD as PCP - General (Internal Medicine)  CHIEF COMPLAINTS/PURPOSE OF CONSULTATION:  Severe anemia  HISTORY OF PRESENTING ILLNESS:  Erika [name] 80 y.o. Caucasian female , who was admitted to the hospital with complaints of fatigue and shortness of breath to minimal exertion and was found to have severe anemia with a hemoglobin of 5.9. She did not have any episodes of black-colored stool or blood in the stool. She was transfused 2 units of packed red cells which brought the hemoglobin up to 8.4 today. Since the transfusion patient has been feeling a lot better. She is still somewhat weak and fatigued. We're consulted to assess if the bone marrow biopsy is needed to evaluate the for anemia.  Patient reports that about a month ago she received a flu vaccination that led to severe pain in the home along with a rash. She took Benadryl for a few days off of this. More recently she was having increased urinary frequency and went [facility] and they put her on Bactrim. She took Bactrim for a couple of days and stopped taking it after she saw her primary care physician. When she was found to be severely anemic in the clinic, she was asked to come to the emergency room and admitted.   I reviewed her records extensively and collaborated the history with the patient.  MEDICAL HISTORY:  Past Medical History  Diagnosis Date  . History of blood transfusion 10/09/2013  . Heart murmur dx'd 10/09/2013  . Anemia     "just today" (10/09/2013)  . Migraine     "just when I was going thru the change of life"  . Arthritis     "knees" (10/09/2013)  . Stone, kidney     SURGICAL HISTORY: Past Surgical History  Procedure Laterality Date  . Appendectomy  1939  . Tonsillectomy  1941  . Total abdominal hysterectomy  1971  . Back surgery    . Thoracic disc surgery  1997    "[place]"  . Cholecystectomy  1983  . Excisional  hemorrhoidectomy  1965  . Knee arthroscopy Bilateral ?2001  . Dilation and curettage of uterus  "several"  . Cataract extraction w/ intraocular lens  implant, bilateral Bilateral 2001  . Partial nephrectomy  1997  . Kidney cyst removal Right 1997 X 2    /notes 12/10/2008    SOCIAL HISTORY: History   Social History  . Marital Status: Widowed    Spouse Name: N/A    Number of Children: N/A  . Years of Education: N/A   Occupational History  . Not on file.   Social History Main Topics  . Smoking status: Never Smoker   . Smokeless tobacco: Never Used  . Alcohol Use: No  . Drug Use: No  . Sexual Activity: No   Other Topics Concern  . Not on file   Social History Narrative  . No narrative on file    FAMILY HISTORY: History reviewed. No pertinent family history.  ALLERGIES:  is allergic to sulfa antibiotics and penicillins.  MEDICATIONS:  Current Facility-Administered Medications  Medication Dose Route Frequency Provider Last Rate Last Dose  . acetaminophen (TYLENOL) tablet 650 mg  650 mg Oral Q6H PRN Kinnie Feil, MD       Or  . acetaminophen (TYLENOL) suppository 650 mg  650 mg Rectal Q6H PRN Kinnie Feil, MD      . cefTRIAXone (ROCEPHIN) 1  g in dextrose 5 % 50 mL IVPB  1 g Intravenous Q24H Romona Curls, RPH   1 g at 10/10/13 8921  . cholecalciferol (VITAMIN D) tablet 1,000 Units  1,000 Units Oral Daily Kinnie Feil, MD   1,000 Units at 10/10/13 1009  . docusate sodium (COLACE) capsule 100 mg  100 mg Oral BID Melton Alar, PA-C   100 mg at 10/10/13 1441  . losartan (COZAAR) tablet 50 mg  50 mg Oral Daily Kinnie Feil, MD   50 mg at 10/10/13 1007  . multivitamin with minerals tablet 1 tablet  1 tablet Oral Daily Kinnie Feil, MD   1 tablet at 10/10/13 1009  . ondansetron (ZOFRAN) tablet 4 mg  4 mg Oral Q6H PRN Kinnie Feil, MD       Or  . ondansetron (ZOFRAN) injection 4 mg  4 mg Intravenous Q6H PRN Kinnie Feil, MD      . potassium  chloride SA (K-DUR,KLOR-CON) CR tablet 20 mEq  20 mEq Oral Daily Melton Alar, PA-C   20 mEq at 10/10/13 0802  . sodium chloride 0.9 % injection 3 mL  3 mL Intravenous Q12H Kinnie Feil, MD   3 mL at 10/09/13 2115    REVIEW OF SYSTEMS:   Constitutional: Denies fevers, chills or abnormal night sweats, complaints of fatigue, shortness of breath exertion Eyes: Denies blurriness of vision, double vision or watery eyes Ears, nose, mouth, throat, and face: Denies mucositis or sore throat Respiratory: Denies cough, shortness of breath Cardiovascular: Denies palpitation, chest discomfort or lower extremity swelling Gastrointestinal:  Denies nausea, heartburn or change in bowel habits Skin: Denies abnormal skin rashes Lymphatics: Denies new lymphadenopathy or easy bruising Neurological:Denies numbness, tingling or new weaknesses Behavioral/Psych: Mood is stable, no new changes  All other systems were reviewed with the patient and are negative.  PHYSICAL EXAMINATION: ECOG PERFORMANCE STATUS: 2 - Symptomatic, <50% confined to bed  Filed Vitals:   10/10/13 1714  BP: 128/63  Pulse: 84  Temp: 99.2 F (37.3 C)  Resp: 18   Filed Weights   10/09/13 1500 10/10/13 0516  Weight: 156 lb (70.761 kg) 150 lb 3.2 oz (68.13 kg)    GENERAL:alert, no distress and comfortable SKIN: Pallor EYES: normal, conjunctiva are pink and non-injected, sclera clear OROPHARYNX:no exudate, no erythema and lips, buccal mucosa, and tongue normal  NECK: supple, thyroid normal size, non-tender, without nodularity LYMPH:  no palpable lymphadenopathy in the cervical, axillary or inguinal LUNGS: clear to auscultation and percussion with normal breathing effort HEART: regular rate & rhythm and no murmurs and no lower extremity edema ABDOMEN:abdomen soft, non-tender and normal bowel sounds Musculoskeletal:no cyanosis of digits and no clubbing  PSYCH: alert & oriented x 3 with fluent speech NEURO: no focal  motor/sensory deficits  LABORATORY DATA:  I have reviewed the data as listed Lab Results  Component Value Date   WBC 2.6* 10/10/2013   HGB 8.4* 10/10/2013   HCT 24.0* 10/10/2013   MCV 102.1* 10/10/2013   PLT 144* 10/10/2013   Lab Results  Component Value Date   NA 136* 10/10/2013   K 3.7 10/10/2013   CL 98 10/10/2013   CO2 25 10/10/2013    ASSESSMENT AND PLAN:  Severe anemia: Slightly macrocytic with a normal J94 folic acid and iron studies. I discussed with her different causes of anemia. I suspect that she has had long-standing low grade anemia but over the past month either because of the flu  vaccine or the urinary infection or the Bactrim, patient's hemoglobin got worse to the point that she became symptomatic.  I recommend getting reticulocyte count to evaluate if the cause of anemia is lack of production. Given her advanced age, I do not plan on doing a bone marrow biopsy. We will treat her conservatively with monitoring and transfusion as needed. If her hemoglobin remains stable over 8 g, she can be followed routinely. Once the stressor on the bone marrow is resolved, patient's hemoglobin may stabilize.  I plan on seeing him in my office in 3-4 weeks' time to reassess her hemoglobin. I recommended that she needs to be checked in the interim within a week after discharge to make sure the hemoglobin remains over 8 g.   All questions were answered. The patient knows to call the clinic with any problems, questions or concerns. I spent 40 minutes counseling the patient face to face. The total time spent in the appointment was 60 minutes and more than 50% was on counseling.     Rulon Eisenmenger, MD _0 @ 6:57 PM

## 2013-10-10 NOTE — Progress Notes (Signed)
PT Cancellation Note  Patient Details Name: Erika [name] MRN: [id number] DOB: February 16, 1919   Cancelled Treatment:    Reason Eval/Treat Not Completed: Patient at procedure or test/unavailable Pt currently off unit. Will follow up for PT evaluation as time allows.  [place], [place]   Ellouise Newer 10/10/2013, 2:59 PM

## 2013-10-10 NOTE — Progress Notes (Addendum)
PROGRESS NOTE  Erika [name] YJE:[id number] DOB: 05-02-1919 DOA: 10/09/2013 PCP: Valaria Good, MD  HPI/Subjective: Pt is a 80 yo female admitted for symptomatic anemia with a Hgb of 5.9 on admission.  She received two units of blood on 9/22 and states she is feeling better with no SOB on 9/23.    Assessment/Plan: Macrocytic symptomatic anemia  - Unclear etiology; no s/s of acute bleeding; occult blood test negative in ED  - Hgb 5.9 with MCV of 118 on admission. - transfused 2 units in ED; Hg increased to 8.4 on 9/23; Will continue transfuse PRN. - CBC upon admission shows pancytopenia and macrocytic anemia. -? Underlying MDS vs acute side effect of bactrim; no recent CBC to compare  - LDH, LFTs, haptoglobin, iron, ferritin, Vitamin B12 and folate do not point to etiology of anemia. - Consulted hematology.  Consider BM biopsy and f/u peripheral smear.  Acute CHF on chronic;  - Discontinue gentle IV diuresis - Troponins are negative.  EKG shows sinus tachycardia with premature supraventricular complexes. - 2D Echo ordered on admission, but not yet completed - Continue daily weights and monitoring I/Os.   Recent UTI - D/c bactrim (9/22) - Continue Rocephin - Follow up with cultures.   DVT Prophylaxis:  SCDs  Code Status: FULL Family Communication: Spoke with son at bedside.   Disposition Plan: Home pending medical clearance and stable Hgb. Son lives directly next door.  Consultants:  Hematology  Procedures:  2D Echo pending.  Antibiotics: Anti-infectives   Start     Dose/Rate Route Frequency Ordered Stop   10/09/13 2200  cefTRIAXone (ROCEPHIN) 1 g in dextrose 5 % 50 mL IVPB     1 g 100 mL/hr over 30 Minutes Intravenous Every 24 hours 10/09/13 1815     10/09/13 1730  cefTRIAXone (ROCEPHIN) 1 g in dextrose 5 % 50 mL IVPB  Status:  Discontinued     1 g 100 mL/hr over 30 Minutes Intravenous Every 24 hours 10/09/13 1703 10/09/13 1815      Objective: Filed Vitals:    10/09/13 2340 10/10/13 0023 10/10/13 0516 10/10/13 1005  BP: 133/67 147/68 132/64 120/71  Pulse: 85 81 78 99  Temp: 98.3 F (36.8 C) 97.6 F (36.4 C) 98.3 F (36.8 C)   TempSrc: Oral Oral Oral   Resp: 16 16 16    Height:      Weight:   68.13 kg (150 lb 3.2 oz)   SpO2: 97% 97% 97%     Intake/Output Summary (Last 24 hours) at 10/10/13 1309 Last data filed at 10/10/13 1206  Gross per 24 hour  Intake 1038.5 ml  Output    550 ml  Net  488.5 ml   Filed Weights   10/09/13 1500 10/10/13 0516  Weight: 70.761 kg (156 lb) 68.13 kg (150 lb 3.2 oz)    Exam: General: Well developed, well nourished, elderly female, NAD, appears stated age.  Son at bedside. HEENT:  Anicteic Sclera, MMM.  Neck is supple and without masses. Cardiovascular: RRR, systolic murmur at LSB  Respiratory: Clear to auscultation bilaterally with equal chest rise  Abdomen: Soft, nontender, nondistended, + bowel sounds  Extremities: Warm without cyanosis or clubbing.  1+ edema in lower extremities. Neuro: AAOx3, cranial nerves grossly intact. Strength 5/5 in upper and lower extremities  Psych: Normal affect and demeanor with intact judgement and insight.  Hard of hearing.  Data Reviewed: Basic Metabolic Panel:  Recent Labs Lab 10/09/13 1315 10/10/13 0210  NA 136* 136*  K 4.4 3.7  CL 101 98  CO2  --  25  GLUCOSE 113* 106*  BUN 22 21  CREATININE 0.90 0.90  CALCIUM  --  9.0   Liver Function Tests:  Recent Labs Lab 10/10/13 0210  AST 18  ALT 11  ALKPHOS 69  BILITOT 1.0  PROT 6.6  ALBUMIN 3.3*   CBC:  Recent Labs Lab 10/09/13 1249 10/09/13 1315 10/10/13 0210  WBC 2.0*  --  2.6*  NEUTROABS 0.6*  --   --   HGB 5.9* 6.5* 8.4*  HCT 17.6* 19.0* 24.0*  MCV 118.9*  --  102.1*  PLT 150  --  144*   Cardiac Enzymes:  Recent Labs Lab 10/10/13 0210 10/10/13 1040  TROPONINI <0.30 <0.30   BNP (last 3 results)  Recent Labs  10/09/13 1251  PROBNP 1194.0*   Studies: Ct Abdomen Pelvis Wo  Contrast  10/09/2013   CLINICAL DATA:  Epigastric pain.  EXAM: CT ABDOMEN AND PELVIS WITHOUT CONTRAST  TECHNIQUE: Multidetector CT imaging of the abdomen and pelvis was performed following the standard protocol without IV contrast.  COMPARISON:  None.  FINDINGS: Multilevel degenerative disc disease is noted in the lumbar spine. Old L1 compression fracture is noted. Visualized lung bases appear normal. Large hiatal hernia is noted.  Left hepatic cyst is noted. Status post cholecystectomy. The spleen and pancreas appear normal. Simple right renal cysts are noted. Mild right renal atrophy is noted. Cortical scarring of upper pole of left kidney is noted. No hydronephrosis or renal obstruction is noted. Atherosclerotic calcifications of abdominal aorta are noted without aneurysm formation. There is no evidence of bowel obstruction. Stool is noted in the right colon. Minimal sigmoid diverticulosis is noted without inflammation urinary bladder appears normal. 3.9 cm right ovarian cyst is noted. Simple right renal cysts are noted.  IMPRESSION: Large hiatal hernia is noted.  Bilateral nephrolithiasis is noted without hydronephrosis or renal obstruction.  Mild sigmoid diverticulosis is noted without inflammation.  3.9 cm probable simple right ovarian cyst is noted. Followup ultrasound in 1 year is recommended to ensure stability.   Electronically Signed   By: Sabino Dick M.D.   On: 10/09/2013 14:04   Dg Chest 2 View  10/09/2013   CLINICAL DATA:  Shortness of breath and dizziness  EXAM: CHEST  2 VIEW  COMPARISON:  February 28, 2005  FINDINGS: There is no edema or consolidation. Heart is borderline enlarged with pulmonary vascularity within normal limits. Most of the stomach is above the diaphragm. No pneumothorax. There is evidence of prior fracture involving the proximal right humerus.  IMPRESSION: Most of the stomach is above the diaphragm. No edema or consolidation. Heart borderline enlarged. Prior trauma proximal  right humerus.   Electronically Signed   By: Lowella Grip M.D.   On: 10/09/2013 13:51    Scheduled Meds: . cefTRIAXone (ROCEPHIN)  IV  1 g Intravenous Q24H  . cholecalciferol  1,000 Units Oral Daily  . furosemide  20 mg Intravenous BID  . losartan  50 mg Oral Daily  . multivitamin with minerals  1 tablet Oral Daily  . potassium chloride  20 mEq Oral Daily  . sodium chloride  3 mL Intravenous Q12H   Continuous Infusions:   Principal Problem:   Symptomatic anemia Active Problems:   SOB (shortness of breath)   DOE (dyspnea on exertion)   UTI (lower urinary tract infection)   Rockwell Germany, PA-S2  Imogene Burn, PA-C [facility] Pager [phone]. If 7PM-7AM, please contact night-coverage  at [email or web address], password [facility] 10/10/2013, 1:09 PM  LOS: 1 day   Attending Patient was seen, examined,treatment plan was discussed with the Physician extender. I have directly reviewed the clinical findings, lab, imaging studies and management of this patient in detail. I have made the necessary changes to the above noted documentation, and agree with the documentation, as recorded by the Physician extender.  Admitted with symptomatic anemia, found to have pancytopenia. Given 2 units of PRBC on admission, unfortunately anemia palpating post transfusion-? Reliability. Given long history of symptoms-doubt this is related to Bactrim, suspect bone marrow issue, we'll likely need a bone marrow biopsy at some point. Await hematology evaluation.  Nena Alexander MD [facility].

## 2013-10-11 ENCOUNTER — Other Ambulatory Visit: Payer: Self-pay | Admitting: *Deleted

## 2013-10-11 DIAGNOSIS — D649 Anemia, unspecified: Secondary | ICD-10-CM

## 2013-10-11 LAB — CBC
HCT: 25.3 % — ABNORMAL LOW (ref 36.0–46.0)
Hemoglobin: 8.8 g/dL — ABNORMAL LOW (ref 12.0–15.0)
MCH: 36.1 pg — AB (ref 26.0–34.0)
MCHC: 34.8 g/dL (ref 30.0–36.0)
MCV: 103.7 fL — AB (ref 78.0–100.0)
Platelets: 162 10*3/uL (ref 150–400)
RBC: 2.44 MIL/uL — ABNORMAL LOW (ref 3.87–5.11)
WBC: 2.7 10*3/uL — ABNORMAL LOW (ref 4.0–10.5)

## 2013-10-11 LAB — URINE CULTURE: Colony Count: 50000

## 2013-10-11 MED ORDER — BISACODYL 5 MG PO TBEC
10.0000 mg | DELAYED_RELEASE_TABLET | Freq: Once | ORAL | Status: AC
  Administered 2013-10-11: 10 mg via ORAL
  Filled 2013-10-11: qty 2

## 2013-10-11 NOTE — Discharge Summary (Signed)
Physician Discharge Summary  [name] MWN:[id number] DOB: 01-14-1920 DOA: 10/09/2013  PCP: Valaria Good, MD  Admit date: 10/09/2013 Discharge date: 10/11/2013  Time spent: 40 minutes  Recommendations for Outpatient Follow-up:  1. Recheck Hgb in one week to ensure >8.   2. Recheck Hgb in 3-4 weeks.  Continue management of macrocytic anemia.  Consider reticulocyte count.    3.  Monitor Mild D-CHF 4.  Please check finalized urine culture from 9/22 in [facility]  Discharge Diagnoses:  Principal Problem:   Symptomatic anemia Active Problems:   SOB (shortness of breath)   DOE (dyspnea on exertion)   UTI (lower urinary tract infection)   Discharge Condition: stable  Diet recommendation: heart healthy  Filed Weights   10/09/13 1500 10/10/13 0516 10/11/13 0544  Weight: 70.761 kg (156 lb) 68.13 kg (150 lb 3.2 oz) 69.355 kg (152 lb 14.4 oz)    History of present illness:  Erika [name] is a 80 y.o. female with no significant medical history had intermittent SOB, DOE for 2 weeks associated with L shoulder, muscle pain. Pt also developed dysuria and Dx with UTI.  Started on bactrim two days prior to admission; Since then, she reports worsening SOB, DOE, associated with PND, orthopnea and was found to have severe anemia with Hg of 5.9 in the ED; Pt denies any acute bleeding, no nausea, vomiting or diarrhea, no hematuria, no hematochezia, no hemoptysis;   Hospital Course:  Macrocytic symptomatic anemia  - Unclear etiology; no s/s of acute bleeding; occult blood test negative in ED  - Hgb 5.9 with MCV of 118 on admission.  - Transfused 2 units in ED; Hg increased to 8.4 on 9/23 and 8.8 on 9/24. - CBC upon admission shows pancytopenia and macrocytic anemia.  -? Underlying MDS vs acute side effect of bactrim; no recent CBC to compare  - LDH, LFTs, haptoglobin, iron, ferritin, Vitamin B12 and folate do not point to etiology of anemia.  - Consulted hematology. Do not recommend bone marrow  biopsy due to patient's age.  Considered reticulocyte count. - Patient will follow up with hematology in 3 - 4 weeks.   Acute on chronic Diastolic CHF - Patient appears euvolemic. - Troponins are negative. EKG shows sinus tachycardia with premature supraventricular complexes.  - 2D Echo showed EF 60-65% (9/23).  Findings consistent with diastolic heart failure. - Monitored with daily weights and monitoring I/Os as an inpatient. - Stable for outpatient follow up.  Recent UTI  - D/C bactrim (9/22)  - completed antibiotic course with Rocephin on 9/22 and 9/23. - Patient is asymptomatic upon discharge.   - Follow up with cultures.   Procedures:  2D Echo on 9/23  Consultations:  Hematology  Discharge Exam: Filed Vitals:   10/11/13 1343  BP:   Pulse: 64  Temp:   Resp:    General: Well developed, well nourished, pleasantly sitting in chair, NAD, appears stated age. HEENT: Anicteic Sclera. Neck is supple and without masses.  Cardiovascular: RRR, systolic murmur at LSB.  Respiratory: Clear to auscultation in posterior lung fields bilaterally with equal chest rise  Abdomen: Soft, nontender, nondistended, + bowel sounds  Extremities: Warm without cyanosis or clubbing. 1+ edema in lower extremities.  Neuro: AAOx3, cranial nerves grossly intact. Strength 5/5 in upper and lower extremities  Psych: Normal affect and demeanor with intact judgement and insight.  Discharge Instructions Discharge Instructions   Call MD for:    Complete by:  As directed   Increased shortness of  breath with exertion.     Diet - low sodium heart healthy    Complete by:  As directed      Increase activity slowly    Complete by:  As directed           Current Discharge Medication List    CONTINUE these medications which have NOT CHANGED   Details  Ascorbic Acid (VITAMIN C) 1000 MG tablet Take 1,000 mg by mouth daily.    b complex vitamins tablet Take 1 tablet by mouth daily.    Cholecalciferol  (VITAMIN D-3) 1000 UNITS CAPS Take 1 capsule by mouth daily.    losartan (COZAAR) 50 MG tablet Take 50 mg by mouth daily.     Multiple Vitamin (MULTIVITAMIN WITH MINERALS) TABS tablet Take 1 tablet by mouth daily.      STOP taking these medications     sulfamethoxazole-trimethoprim (BACTRIM DS) 800-160 MG per tablet        Allergies  Allergen Reactions  . Sulfa Antibiotics Other (See Comments)    unknown  . Penicillins Rash    Pt was on penicillin for 6 weeks in 1959 and developed a rash towards the latter stages of treatment   Follow-up Information   Follow up with Rulon Eisenmenger, MD On 11/09/2013. (appt at 10 am)    Specialty:  Hematology and Oncology   Contact information:   [place] [address] [phone]       Follow up with Valaria Good, MD. Schedule an appointment as soon as possible for a visit on 10/22/2013. (to re-check CBC and ensure Hgb is >8  appointment is 10/22/13 at 11am)    Specialty:  Internal Medicine   Contact information:   [place] [address] [phone]        The results of significant diagnostics from this hospitalization (including imaging, microbiology, ancillary and laboratory) are listed below for reference.    Significant Diagnostic Studies: Ct Abdomen Pelvis Wo Contrast  10/09/2013   CLINICAL DATA:  Epigastric pain.  EXAM: CT ABDOMEN AND PELVIS WITHOUT CONTRAST  TECHNIQUE: Multidetector CT imaging of the abdomen and pelvis was performed following the standard protocol without IV contrast.  COMPARISON:  None.  FINDINGS: Multilevel degenerative disc disease is noted in the lumbar spine. Old L1 compression fracture is noted. Visualized lung bases appear normal. Large hiatal hernia is noted.  Left hepatic cyst is noted. Status post cholecystectomy. The spleen and pancreas appear normal. Simple right renal cysts are noted. Mild right renal atrophy is noted. Cortical scarring of upper pole of  left kidney is noted. No hydronephrosis or renal obstruction is noted. Atherosclerotic calcifications of abdominal aorta are noted without aneurysm formation. There is no evidence of bowel obstruction. Stool is noted in the right colon. Minimal sigmoid diverticulosis is noted without inflammation urinary bladder appears normal. 3.9 cm right ovarian cyst is noted. Simple right renal cysts are noted.  IMPRESSION: Large hiatal hernia is noted.  Bilateral nephrolithiasis is noted without hydronephrosis or renal obstruction.  Mild sigmoid diverticulosis is noted without inflammation.  3.9 cm probable simple right ovarian cyst is noted. Followup ultrasound in 1 year is recommended to ensure stability.   Electronically Signed   By: Sabino Dick M.D.   On: 10/09/2013 14:04   Dg Chest 2 View  10/09/2013   CLINICAL DATA:  Shortness of breath and dizziness  EXAM: CHEST  2 VIEW  COMPARISON:  February 28, 2005  FINDINGS:  There is no edema or consolidation. Heart is borderline enlarged with pulmonary vascularity within normal limits. Most of the stomach is above the diaphragm. No pneumothorax. There is evidence of prior fracture involving the proximal right humerus.  IMPRESSION: Most of the stomach is above the diaphragm. No edema or consolidation. Heart borderline enlarged. Prior trauma proximal right humerus.   Electronically Signed   By: Lowella Grip M.D.   On: 10/09/2013 13:51   2D Echocardiogram without contrast 10/10/2013 Study Conclusions - Left ventricle: The cavity size was normal. Wall thickness was increased in a pattern of mild LVH. Systolic function was normal. The estimated ejection fraction was in the range of 60% to 65%. Doppler parameters are consistent with abnormal left ventricular relaxation (grade 1 diastolic dysfunction). - Mitral valve: Calcified annulus. Mildly thickened leaflets . - Left atrium: The atrium was moderately dilated. - Right atrium: The atrium was mildly dilated. - Atrial  septum: There was increased thickness of the septum, consistent with lipomatous hypertrophy.  Labs: Basic Metabolic Panel:  Recent Labs Lab 10/09/13 1315 10/10/13 0210  NA 136* 136*  K 4.4 3.7  CL 101 98  CO2  --  25  GLUCOSE 113* 106*  BUN 22 21  CREATININE 0.90 0.90  CALCIUM  --  9.0   Liver Function Tests:  Recent Labs Lab 10/10/13 0210  AST 18  ALT 11  ALKPHOS 69  BILITOT 1.0  PROT 6.6  ALBUMIN 3.3*   CBC:  Recent Labs Lab 10/09/13 1249 10/09/13 1315 10/10/13 0210 10/11/13 0727  WBC 2.0*  --  2.6* 2.7*  NEUTROABS 0.6*  --   --   --   HGB 5.9* 6.5* 8.4* 8.8*  HCT 17.6* 19.0* 24.0* 25.3*  MCV 118.9*  --  102.1* 103.7*  PLT 150  --  144* 162   Cardiac Enzymes:  Recent Labs Lab 10/10/13 0210 10/10/13 1040 10/10/13 1645  TROPONINI <0.30 <0.30 <0.30   BNP: BNP (last 3 results)  Recent Labs  10/09/13 1251  PROBNP 1194.0*   Signed:   Rockwell Germany, PA-S2  Imogene Burn, PA-C [facility] 10/11/2013, 2:15 PM  Attending Patient was seen, examined,treatment plan was discussed with the Physician extender. I have directly reviewed the clinical findings, lab, imaging studies and management of this patient in detail. I have made the necessary changes to the above noted documentation, and agree with the documentation, as recorded by the Physician extender.  Seen by Hem-Onc, not a candidate for BM bx, recommending prn transfusing. Hb stable at 8.8, ok for discharge. Hem-Onc to follow as outpatient.   Nena Alexander MD [facility].

## 2013-10-11 NOTE — Progress Notes (Signed)
NURSING PROGRESS NOTE  JAZARA SWINEY [id number] Discharge Data: 10/11/2013 10:52 AM Attending Provider: Jonetta Osgood, MD EUM:[id number],XVQMG  Jerilynn Mages, MD     Charlean Sanfilippo to be D/C'd Home per MD order.  Discussed with the patient the After Visit Summary and all questions fully answered. All IV's discontinued with no bleeding noted. All belongings returned to patient for patient to take home.   Last Vital Signs:  Blood pressure 117/53, pulse 79, temperature 97.9 F (36.6 C), temperature source Oral, resp. rate 18, height 5\' 3"  (1.6 m), weight 69.355 kg (152 lb 14.4 oz), SpO2 98.00%.  Discharge Medication List   Medication List    STOP taking these medications       sulfamethoxazole-trimethoprim 800-160 MG per tablet  Commonly known as:  BACTRIM DS      TAKE these medications       b complex vitamins tablet  Take 1 tablet by mouth daily.     losartan 50 MG tablet  Commonly known as:  COZAAR  Take 50 mg by mouth daily.     multivitamin with minerals Tabs tablet  Take 1 tablet by mouth daily.     vitamin C 1000 MG tablet  Take 1,000 mg by mouth daily.     Vitamin D-3 1000 UNITS Caps  Take 1 capsule by mouth daily.

## 2013-10-11 NOTE — Discharge Instructions (Signed)
Follow up with your primary care provider in one week to ensure that your hemoglobin is above 8.  Follow up with the hematologist in 3-4 weeks to follow up with anemia management.    Call your physician if you feel increased weakness or shortness of breath with exertion.

## 2013-10-11 NOTE — Care Management Note (Signed)
    Page 1 of 1   10/11/2013     12:54:50 PM CARE MANAGEMENT NOTE 10/11/2013  Patient:  Erika [name]   Account Number:  [id number]  Date Initiated:  10/11/2013  Documentation initiated by:  Tomi Bamberger  Subjective/Objective Assessment:   dx doe, symptomatic anemia  admit- lives alone.     Action/Plan:   pt eval- no pt f/u   Anticipated DC Date:  10/11/2013   Anticipated DC Plan:  [place]  CM consult      Choice offered to / List presented to:             Status of service:  Completed, signed off Medicare Important Message given?  NA - LOS <3 / Initial given by admissions (If response is "NO", the following Medicare IM given date fields will be blank) Date Medicare IM given:   Medicare IM given by:   Date Additional Medicare IM given:   Additional Medicare IM given by:    Discharge Disposition:  HOME/SELF CARE  Per UR Regulation:  Reviewed for med. necessity/level of care/duration of stay  If discussed at [place] of Stay Meetings, dates discussed:    Comments:  9j/24/15 [place], BSN [phone] per pysical therapy , no pt f/u needed.

## 2013-10-11 NOTE — Evaluation (Signed)
Physical Therapy Evaluation/ Discharge Patient Details Name: Erika [name] MRN: [id number] DOB: 06/12/1919 Today's Date: 10/11/2013   History of Present Illness  Erika [name] is a 80 y.o. female with no significant medical history had intermittent SOB, DOE for 2 weeks associated with L shoulder, muscle pain. Pt also developed dysuria and Dx with UTI, started on bactrim two days prior to admission;  Since then , she reports worsening SOB, DOE, associated with PND, orthopnea and found to have severe anemia with Hg of 5.9; Pt denies any acut e bleeding, no nausea, vomiting or diarrhea, no hematuria, no hematochezia, no hemoptysis  Clinical Impression  Pt very pleasant and moving well. Pt reports generalized weakness but with myotome testing bil LE 4-5/5 throughout. Pt with varied story of whether she performs HEP and pt educated for, performed and gave HEP for use at home with son present. Pt educated for DME use with gait to prevent falls but otherwise is at baseline with minor fatigue with long hall ambulation but no drop in sats. All education completed with pt very near baseline without further needs at present with pt and son aware and agreeable.     Follow Up Recommendations No PT follow up    Equipment Recommendations  None recommended by PT    Recommendations for Other Services       Precautions / Restrictions Precautions Precautions: Fall      Mobility  Bed Mobility               General bed mobility comments: Pt EOB on arrival  Transfers Overall transfer level: Modified independent                  Ambulation/Gait Ambulation/Gait assistance: Supervision Ambulation Distance (Feet): 300 Feet Assistive device: Straight cane Gait Pattern/deviations: WFL(Within Functional Limits)   Gait velocity interpretation: Below normal speed for age/gender    Stairs Stairs: Yes Stairs assistance: Modified independent (Device/Increase time) Stair Management: One rail  Left;One rail Right;Forwards;Alternating pattern Number of Stairs: 4 General stair comments: Pt initially attempting to walk without cane but reaching for environmental supports. Pt cued for cane use and educated to use at all times inside and on flat surfaces with RW for uneven ground  Wheelchair Mobility    Modified Rankin (Stroke Patients Only)       Balance                                             Pertinent Vitals/Pain Pain Assessment: No/denies pain    Home Living Family/patient expects to be discharged to:: Private residence Living Arrangements: Alone Available Help at Discharge: Family;Available PRN/intermittently Type of Home: House Home Access: Stairs to enter Entrance Stairs-Rails: Psychiatric nurse of Steps: 4 Home Layout: One level Home Equipment: Walker - 2 wheels;Cane - single point      Prior Function Level of Independence: Independent         Comments: family typically assists with grocery shopping     Hand Dominance        Extremity/Trunk Assessment   Upper Extremity Assessment: Overall WFL for tasks assessed           Lower Extremity Assessment: Overall WFL for tasks assessed      Cervical / Trunk Assessment: Kyphotic  Communication   Communication: No difficulties  Cognition Arousal/Alertness: Awake/alert Behavior During Therapy: WFL for tasks  assessed/performed Overall Cognitive Status: Within Functional Limits for tasks assessed                      General Comments      Exercises General Exercises - Lower Extremity Long Arc Quad: AROM;Seated;Both;10 reps Hip Flexion/Marching: AROM;Seated;Both;10 reps Toe Raises: AROM;Seated;Both;10 reps Heel Raises: AROM;Seated;Both;10 reps      Assessment/Plan    PT Assessment Patent does not need any further PT services  PT Diagnosis     PT Problem List    PT Treatment Interventions     PT Goals (Current goals can be found in the  Care Plan section) Acute Rehab PT Goals PT Goal Formulation: No goals set, d/c therapy    Frequency     Barriers to discharge        Co-evaluation               End of Session   Activity Tolerance: Patient tolerated treatment well Patient left: in chair;with call bell/phone within reach;with family/visitor present Nurse Communication: Mobility status         Time: 1240-1257 PT Time Calculation (min): 17 min   Charges:   PT Evaluation $Initial PT Evaluation Tier I: 1 Procedure PT Treatments $Therapeutic Activity: 8-22 mins   PT G CodesMelford Aase 10/11/2013, 1:46 PM Elwyn Reach, [place]

## 2013-10-23 ENCOUNTER — Telehealth: Payer: Self-pay | Admitting: *Deleted

## 2013-10-23 NOTE — Telephone Encounter (Signed)
Received CBC results from [place]. Sent to scan.

## 2013-10-30 ENCOUNTER — Telehealth: Payer: Self-pay

## 2013-10-30 ENCOUNTER — Telehealth: Payer: Self-pay | Admitting: Hematology and Oncology

## 2013-10-30 DIAGNOSIS — D649 Anemia, unspecified: Secondary | ICD-10-CM

## 2013-10-30 NOTE — Telephone Encounter (Signed)
Returned call to pt son - Hgb is down to 7.0.  Pt is SOB, weak and fatigued.  Per Dr Lindi Adie - bring pt in Fri at 2, labs CBC before.  Pt son voiced understanding.  pof sent.

## 2013-10-30 NOTE — Telephone Encounter (Signed)
no answer....added appts per pof

## 2013-10-30 NOTE — Progress Notes (Signed)
CBC rcvd from [facility] 10/26/13.  Copy to Dr Lindi Adie.  Sent to scan.

## 2013-11-02 ENCOUNTER — Other Ambulatory Visit (HOSPITAL_BASED_OUTPATIENT_CLINIC_OR_DEPARTMENT_OTHER): Payer: Medicare Other

## 2013-11-02 ENCOUNTER — Ambulatory Visit (HOSPITAL_COMMUNITY)
Admission: RE | Admit: 2013-11-02 | Discharge: 2013-11-02 | Disposition: A | Discharge status: Home / Self Care | Payer: Medicare Other | Source: Ambulatory Visit | Attending: Hematology and Oncology | Admitting: Hematology and Oncology

## 2013-11-02 ENCOUNTER — Ambulatory Visit (HOSPITAL_BASED_OUTPATIENT_CLINIC_OR_DEPARTMENT_OTHER): Payer: Medicare Other

## 2013-11-02 ENCOUNTER — Ambulatory Visit: Payer: Medicare Other

## 2013-11-02 ENCOUNTER — Telehealth: Payer: Self-pay | Admitting: Hematology and Oncology

## 2013-11-02 ENCOUNTER — Other Ambulatory Visit (HOSPITAL_BASED_OUTPATIENT_CLINIC_OR_DEPARTMENT_OTHER): Payer: Medicare Other | Admitting: Hematology and Oncology

## 2013-11-02 ENCOUNTER — Ambulatory Visit (HOSPITAL_BASED_OUTPATIENT_CLINIC_OR_DEPARTMENT_OTHER): Payer: Medicare Other | Admitting: Hematology and Oncology

## 2013-11-02 VITALS — BP 129/72 | HR 115 | Temp 98.7°F | Resp 18 | Ht 63.0 in | Wt 153.6 lb

## 2013-11-02 DIAGNOSIS — D469 Myelodysplastic syndrome, unspecified: Secondary | ICD-10-CM

## 2013-11-02 DIAGNOSIS — D539 Nutritional anemia, unspecified: Secondary | ICD-10-CM

## 2013-11-02 DIAGNOSIS — D649 Anemia, unspecified: Secondary | ICD-10-CM

## 2013-11-02 LAB — URINALYSIS, MICROSCOPIC - CHCC
BLOOD: NEGATIVE
Bilirubin (Urine): NEGATIVE
Glucose: NEGATIVE mg/dL
KETONES: NEGATIVE mg/dL
Nitrite: NEGATIVE
PH: 7.5 (ref 4.6–8.0)
PROTEIN: 30 mg/dL
SPECIFIC GRAVITY, URINE: 1.01 (ref 1.003–1.035)
Urobilinogen, UR: 0.2 mg/dL (ref 0.2–1)

## 2013-11-02 LAB — CBC WITH DIFFERENTIAL/PLATELET
BASO%: 0.4 % (ref 0.0–2.0)
Basophils Absolute: 0 10*3/uL (ref 0.0–0.1)
EOS%: 0.1 % (ref 0.0–7.0)
Eosinophils Absolute: 0 10*3/uL (ref 0.0–0.5)
HCT: 19.1 % — ABNORMAL LOW (ref 34.8–46.6)
HGB: 6.3 g/dL — CL (ref 11.6–15.9)
LYMPH%: 68.1 % — ABNORMAL HIGH (ref 14.0–49.7)
MCH: 35.4 pg — AB (ref 25.1–34.0)
MCHC: 33 g/dL (ref 31.5–36.0)
MCV: 107.1 fL — ABNORMAL HIGH (ref 79.5–101.0)
MONO#: 0.1 10*3/uL (ref 0.1–0.9)
MONO%: 6 % (ref 0.0–14.0)
NEUT#: 0.6 10*3/uL — ABNORMAL LOW (ref 1.5–6.5)
NEUT%: 25.4 % — ABNORMAL LOW (ref 38.4–76.8)
Platelets: 170 10*3/uL (ref 145–400)
RBC: 1.78 10*6/uL — ABNORMAL LOW (ref 3.70–5.45)
RDW: 27.9 % — AB (ref 11.2–14.5)
WBC: 2.3 10*3/uL — ABNORMAL LOW (ref 3.9–10.3)
lymph#: 1.6 10*3/uL (ref 0.9–3.3)

## 2013-11-02 LAB — RETICULOCYTES
Immature Retic Fract: 14.9 % — ABNORMAL HIGH (ref 1.60–10.00)
RBC: 1.83 10*6/uL — AB (ref 3.70–5.45)
Retic %: 1.22 % (ref 0.70–2.10)
Retic Ct Abs: 22.33 10*3/uL — ABNORMAL LOW (ref 33.70–90.70)

## 2013-11-02 LAB — PREPARE RBC (CROSSMATCH)

## 2013-11-02 LAB — ABO/RH: ABO/RH(D): A POS

## 2013-11-02 NOTE — Telephone Encounter (Signed)
, °

## 2013-11-02 NOTE — Progress Notes (Signed)
Patient Care Team: Valaria Good, MD as PCP - General (Internal Medicine)  DIAGNOSIS: Macrocytic anemia most likely myelodysplastic syndrome with refractory anemia CHIEF COMPLIANT: Shortness of breath and fatigue  INTERVAL HISTORY: Erika [name] is a 80-year-old Caucasian with above-mentioned history of severe anemia whom I followed in the hospital. She comes back in with worsening anemia last week her hemoglobin was 7.3 and today her hemoglobin is 0.7. She feels very tired and short of breath. She is accompanied today by her son.  REVIEW OF SYSTEMS:   Constitutional: Denies fevers, chills or abnormal weight loss Eyes: Denies blurriness of vision Ears, nose, mouth, throat, and face: Denies mucositis or sore throat Respiratory: No cough Cardiovascular: Shortness of breath Gastrointestinal:  Denies nausea, heartburn or change in bowel habits Skin: Denies abnormal skin rashes Lymphatics: Denies new lymphadenopathy or easy bruising Neurological:Denies numbness, tingling or new weaknesses Behavioral/Psych: Mood is stable, no new changes   All other systems were reviewed with the patient and are negative.  I have reviewed the past medical history, past surgical history, social history and family history with the patient and they are unchanged from previous note.  ALLERGIES:  is allergic to sulfa antibiotics and penicillins.  MEDICATIONS:  Current Outpatient Prescriptions  Medication Sig Dispense Refill  . Ascorbic Acid (VITAMIN C) 1000 MG tablet Take 1,000 mg by mouth daily.      Marland Kitchen b complex vitamins tablet Take 1 tablet by mouth daily.      . Cholecalciferol (VITAMIN D-3) 1000 UNITS CAPS Take 1 capsule by mouth daily.      . Multiple Vitamin (MULTIVITAMIN WITH MINERALS) TABS tablet Take 1 tablet by mouth daily.      . vitamin B-12 (CYANOCOBALAMIN) 1000 MCG tablet Take 1,000 mcg by mouth daily.       No current facility-administered medications for this visit.    PHYSICAL  EXAMINATION: ECOG PERFORMANCE STATUS: 3 - Symptomatic, >50% confined to bed  Filed Vitals:   11/02/13 1325  BP: 129/72  Pulse: 115  Temp: 98.7 F (37.1 C)  Resp: 18   Filed Weights   11/02/13 1325  Weight: 153 lb 9.6 oz (69.673 kg)    GENERAL:alert, no distress and comfortable SKIN: skin color, texture, turgor are normal, no rashes or significant lesions EYES: normal, Conjunctiva are pink and non-injected, sclera clear OROPHARYNX:no exudate, no erythema and lips, buccal mucosa, and tongue normal  NECK: supple, thyroid normal size, non-tender, without nodularity LYMPH:  no palpable lymphadenopathy in the cervical, axillary or inguinal LUNGS: clear to auscultation and percussion with normal breathing effort HEART: regular rate & rhythm and no murmurs and no lower extremity edema ABDOMEN:abdomen soft, non-tender and normal bowel sounds Musculoskeletal:no cyanosis of digits and no clubbing  NEURO: alert & oriented x 3 with fluent speech, no focal motor/sensory deficits  LABORATORY DATA:  I have reviewed the data as listed   Chemistry      Component Value Date/Time   NA 136* 10/10/2013 0210   K 3.7 10/10/2013 0210   CL 98 10/10/2013 0210   CO2 25 10/10/2013 0210   BUN 21 10/10/2013 0210   CREATININE 0.90 10/10/2013 0210      Component Value Date/Time   CALCIUM 9.0 10/10/2013 0210   ALKPHOS 69 10/10/2013 0210   AST 18 10/10/2013 0210   ALT 11 10/10/2013 0210   BILITOT 1.0 10/10/2013 0210       Lab Results  Component Value Date   WBC 2.3* 11/02/2013   HGB 6.3*  11/02/2013   HCT 19.1* 11/02/2013   MCV 107.1* 11/02/2013   PLT 170 11/02/2013   NEUTROABS 0.6* 11/02/2013     RADIOGRAPHIC STUDIES: I have personally reviewed the radiology reports and agreed with their findings. No results found.   ASSESSMENT & PLAN:  Myelodysplastic syndrome Severe macrocytic anemia with normal I26 folic acid I suspect myelodysplastic syndrome based on clinical presentation and findings.  Patient has symptomatic severe recurrent anemia with a today's hemoglobin of 17 and she is very symptomatic. We will type and cross and transfuse 2 units of packed red cells.  I discussed with her the diagnosis of myelodysplastic syndrome and that our approach would be to support her with either blood transfusions or erythropoietin stimulating agent like Aranesp. Patient was agreeable to it we will get started on those injections simultaneously with the blood transfusions. She will return once every 3 weeks for blood count check and Aranesp treatment.    Orders Placed This Encounter  Procedures  . Urine Culture    Standing Status: Future     Number of Occurrences: 1     Standing Expiration Date: 11/02/2014  . Urinalysis with microscopic - [facility]    Standing Status: Future     Number of Occurrences: 1     Standing Expiration Date: 11/02/2014   The patient has a good understanding of the overall plan. she agrees with it. She will call with any problems that may develop before her next visit here.  I spent 15 minutes counseling the patient face to face. The total time spent in the appointment was 20 minutes and more than 50% was on counseling and review of test results    Rulon Eisenmenger, MD 11/02/2013 3:21 PM

## 2013-11-02 NOTE — Assessment & Plan Note (Signed)
Severe macrocytic anemia with normal S56 folic acid I suspect myelodysplastic syndrome based on clinical presentation and findings. Patient has symptomatic severe recurrent anemia with a today's hemoglobin of 17 and she is very symptomatic. We will type and cross and transfuse 2 units of packed red cells.  I discussed with her the diagnosis of myelodysplastic syndrome and that our approach would be to support her with either blood transfusions or erythropoietin stimulating agent like Aranesp. Patient was agreeable to it we will get started on those injections simultaneously with the blood transfusions. She will return once every 3 weeks for blood count check and Aranesp treatment.

## 2013-11-03 ENCOUNTER — Ambulatory Visit (HOSPITAL_BASED_OUTPATIENT_CLINIC_OR_DEPARTMENT_OTHER): Payer: Medicare Other

## 2013-11-03 VITALS — BP 138/64 | HR 74 | Temp 99.2°F | Resp 18

## 2013-11-03 DIAGNOSIS — D649 Anemia, unspecified: Secondary | ICD-10-CM | POA: Diagnosis not present

## 2013-11-03 MED ORDER — ACETAMINOPHEN 325 MG PO TABS
650.0000 mg | ORAL_TABLET | Freq: Once | ORAL | Status: AC
  Administered 2013-11-03: 650 mg via ORAL

## 2013-11-03 MED ORDER — SODIUM CHLORIDE 0.9 % IV SOLN
250.0000 mL | Freq: Once | INTRAVENOUS | Status: AC
  Administered 2013-11-03: 250 mL via INTRAVENOUS

## 2013-11-03 MED ORDER — ACETAMINOPHEN 325 MG PO TABS
ORAL_TABLET | ORAL | Status: AC
  Filled 2013-11-03: qty 2

## 2013-11-03 NOTE — Patient Instructions (Signed)

## 2013-11-04 LAB — TYPE AND SCREEN
ABO/RH(D): A POS
ANTIBODY SCREEN: NEGATIVE
Unit division: 0
Unit division: 0

## 2013-11-06 ENCOUNTER — Telehealth: Payer: Self-pay

## 2013-11-06 DIAGNOSIS — D469 Myelodysplastic syndrome, unspecified: Secondary | ICD-10-CM

## 2013-11-06 MED ORDER — CIPROFLOXACIN HCL 500 MG PO TABS: 500.0000 mg | ORAL_TABLET | Freq: Two times a day (BID) | ORAL | Status: DC

## 2013-11-06 NOTE — Telephone Encounter (Signed)
Returned call to pt son re: cipro.  [facility] did not rcv scrip.  Re-sent - confirmed receipt.  Son voiced understanding.

## 2013-11-06 NOTE — Progress Notes (Signed)
LMOVM on son Erika [name]'s phone- prescription called in to [facility] for UTI.  Call clinic with questions.

## 2013-11-07 LAB — URINE CULTURE

## 2013-11-09 ENCOUNTER — Ambulatory Visit: Payer: Medicare Other | Admitting: Hematology and Oncology

## 2013-11-09 ENCOUNTER — Other Ambulatory Visit: Payer: Medicare Other

## 2013-11-13 ENCOUNTER — Encounter (HOSPITAL_COMMUNITY): Payer: Self-pay | Admitting: Emergency Medicine

## 2013-11-13 ENCOUNTER — Emergency Department (HOSPITAL_COMMUNITY): Payer: Medicare Other

## 2013-11-13 ENCOUNTER — Inpatient Hospital Stay (HOSPITAL_COMMUNITY)
Admission: EM | Admit: 2013-11-13 | Discharge: 2013-11-19 | DRG: 811 | Disposition: A | Discharge status: Skilled Nursing Facility | Payer: Medicare Other | Attending: Internal Medicine | Admitting: Internal Medicine

## 2013-11-13 ENCOUNTER — Other Ambulatory Visit: Payer: Self-pay

## 2013-11-13 DIAGNOSIS — K5900 Constipation, unspecified: Secondary | ICD-10-CM | POA: Diagnosis present

## 2013-11-13 DIAGNOSIS — E860 Dehydration: Secondary | ICD-10-CM | POA: Diagnosis present

## 2013-11-13 DIAGNOSIS — D46Z Other myelodysplastic syndromes: Principal | ICD-10-CM | POA: Diagnosis present

## 2013-11-13 DIAGNOSIS — M1990 Unspecified osteoarthritis, unspecified site: Secondary | ICD-10-CM | POA: Diagnosis present

## 2013-11-13 DIAGNOSIS — G9349 Other encephalopathy: Secondary | ICD-10-CM | POA: Diagnosis present

## 2013-11-13 DIAGNOSIS — Z882 Allergy status to sulfonamides status: Secondary | ICD-10-CM | POA: Diagnosis not present

## 2013-11-13 DIAGNOSIS — Z880 Allergy status to penicillin: Secondary | ICD-10-CM | POA: Diagnosis not present

## 2013-11-13 DIAGNOSIS — R7989 Other specified abnormal findings of blood chemistry: Secondary | ICD-10-CM | POA: Diagnosis present

## 2013-11-13 DIAGNOSIS — Z66 Do not resuscitate: Secondary | ICD-10-CM | POA: Diagnosis present

## 2013-11-13 DIAGNOSIS — R339 Retention of urine, unspecified: Secondary | ICD-10-CM | POA: Diagnosis present

## 2013-11-13 DIAGNOSIS — I4891 Unspecified atrial fibrillation: Secondary | ICD-10-CM | POA: Diagnosis present

## 2013-11-13 DIAGNOSIS — I248 Other forms of acute ischemic heart disease: Secondary | ICD-10-CM | POA: Diagnosis present

## 2013-11-13 DIAGNOSIS — I5030 Unspecified diastolic (congestive) heart failure: Secondary | ICD-10-CM | POA: Diagnosis present

## 2013-11-13 DIAGNOSIS — R197 Diarrhea, unspecified: Secondary | ICD-10-CM | POA: Diagnosis present

## 2013-11-13 DIAGNOSIS — G9340 Encephalopathy, unspecified: Secondary | ICD-10-CM | POA: Diagnosis present

## 2013-11-13 DIAGNOSIS — E872 Acidosis, unspecified: Secondary | ICD-10-CM

## 2013-11-13 DIAGNOSIS — Z6827 Body mass index (BMI) 27.0-27.9, adult: Secondary | ICD-10-CM | POA: Diagnosis not present

## 2013-11-13 DIAGNOSIS — R0602 Shortness of breath: Secondary | ICD-10-CM | POA: Diagnosis present

## 2013-11-13 DIAGNOSIS — R627 Adult failure to thrive: Secondary | ICD-10-CM | POA: Diagnosis present

## 2013-11-13 DIAGNOSIS — D469 Myelodysplastic syndrome, unspecified: Secondary | ICD-10-CM

## 2013-11-13 DIAGNOSIS — D464 Refractory anemia, unspecified: Secondary | ICD-10-CM | POA: Diagnosis present

## 2013-11-13 DIAGNOSIS — E871 Hypo-osmolality and hyponatremia: Secondary | ICD-10-CM | POA: Diagnosis present

## 2013-11-13 DIAGNOSIS — E0781 Sick-euthyroid syndrome: Secondary | ICD-10-CM | POA: Diagnosis present

## 2013-11-13 DIAGNOSIS — D649 Anemia, unspecified: Secondary | ICD-10-CM | POA: Diagnosis present

## 2013-11-13 DIAGNOSIS — E440 Moderate protein-calorie malnutrition: Secondary | ICD-10-CM | POA: Diagnosis present

## 2013-11-13 DIAGNOSIS — R8290 Unspecified abnormal findings in urine: Secondary | ICD-10-CM | POA: Diagnosis present

## 2013-11-13 LAB — URINALYSIS, ROUTINE W REFLEX MICROSCOPIC
BILIRUBIN URINE: NEGATIVE
Glucose, UA: NEGATIVE mg/dL
Ketones, ur: NEGATIVE mg/dL
Leukocytes, UA: NEGATIVE
Nitrite: NEGATIVE
Protein, ur: 100 mg/dL — AB
SPECIFIC GRAVITY, URINE: 1.015 (ref 1.005–1.030)
Urobilinogen, UA: 0.2 mg/dL (ref 0.0–1.0)
pH: 6.5 (ref 5.0–8.0)

## 2013-11-13 LAB — BASIC METABOLIC PANEL
Anion gap: 25 — ABNORMAL HIGH (ref 5–15)
BUN: 23 mg/dL (ref 6–23)
CALCIUM: 9.4 mg/dL (ref 8.4–10.5)
CO2: 19 mEq/L (ref 19–32)
CREATININE: 0.94 mg/dL (ref 0.50–1.10)
Chloride: 87 mEq/L — ABNORMAL LOW (ref 96–112)
GFR, EST AFRICAN AMERICAN: 59 mL/min — AB (ref >90)
GFR, EST NON AFRICAN AMERICAN: 51 mL/min — AB (ref >90)
Glucose, Bld: 62 mg/dL — ABNORMAL LOW (ref 70–99)
Potassium: 2.9 mEq/L — CL (ref 3.7–5.3)
Sodium: 131 mEq/L — ABNORMAL LOW (ref 137–147)

## 2013-11-13 LAB — CBC WITH DIFFERENTIAL/PLATELET
Basophils Absolute: 0 10*3/uL (ref 0.0–0.1)
Basophils Relative: 0 % (ref 0–1)
Eosinophils Absolute: 0 10*3/uL (ref 0.0–0.7)
Eosinophils Relative: 0 % (ref 0–5)
HCT: 30.6 % — ABNORMAL LOW (ref 36.0–46.0)
Hemoglobin: 10.6 g/dL — ABNORMAL LOW (ref 12.0–15.0)
LYMPHS ABS: 3.4 10*3/uL (ref 0.7–4.0)
Lymphocytes Relative: 38 % (ref 12–46)
MCH: 33.4 pg (ref 26.0–34.0)
MCHC: 34.6 g/dL (ref 30.0–36.0)
MCV: 96.5 fL (ref 78.0–100.0)
MONO ABS: 3 10*3/uL — AB (ref 0.1–1.0)
Monocytes Relative: 34 % — ABNORMAL HIGH (ref 3–12)
NEUTROS PCT: 28 % — AB (ref 43–77)
Neutro Abs: 2.5 10*3/uL (ref 1.7–7.7)
Platelets: 238 10*3/uL (ref 150–400)
RBC: 3.17 MIL/uL — ABNORMAL LOW (ref 3.87–5.11)
RDW: 22 % — ABNORMAL HIGH (ref 11.5–15.5)
WBC: 8.9 10*3/uL (ref 4.0–10.5)
nRBC: 3 /100 WBC — ABNORMAL HIGH

## 2013-11-13 LAB — TROPONIN I: TROPONIN I: 2.26 ng/mL — AB (ref <0.30)

## 2013-11-13 LAB — I-STAT CG4 LACTIC ACID, ED: Lactic Acid, Venous: 4.23 mmol/L — ABNORMAL HIGH (ref 0.5–2.2)

## 2013-11-13 LAB — URINE MICROSCOPIC-ADD ON

## 2013-11-13 LAB — MRSA PCR SCREENING: MRSA by PCR: NEGATIVE

## 2013-11-13 MED ORDER — SODIUM CHLORIDE 0.9 % IJ SOLN
3.0000 mL | Freq: Two times a day (BID) | INTRAMUSCULAR | Status: DC
  Administered 2013-11-13 – 2013-11-18 (×8): 3 mL via INTRAVENOUS

## 2013-11-13 MED ORDER — SODIUM CHLORIDE 0.9 % IV BOLUS (SEPSIS)
500.0000 mL | Freq: Once | INTRAVENOUS | Status: AC
  Administered 2013-11-13: 500 mL via INTRAVENOUS

## 2013-11-13 MED ORDER — HYDROCODONE-ACETAMINOPHEN 5-325 MG PO TABS
1.0000 | ORAL_TABLET | ORAL | Status: DC | PRN
  Administered 2013-11-14 – 2013-11-16 (×3): 1 via ORAL
  Filled 2013-11-13 (×3): qty 1

## 2013-11-13 MED ORDER — ACETAMINOPHEN 325 MG PO TABS
650.0000 mg | ORAL_TABLET | Freq: Four times a day (QID) | ORAL | Status: DC | PRN
  Administered 2013-11-13 – 2013-11-18 (×3): 650 mg via ORAL
  Filled 2013-11-13 (×3): qty 2

## 2013-11-13 MED ORDER — ONDANSETRON HCL 4 MG/2ML IJ SOLN: 4.0000 mg | Freq: Four times a day (QID) | INTRAMUSCULAR | Status: DC | PRN

## 2013-11-13 MED ORDER — ONDANSETRON HCL 4 MG/2ML IJ SOLN: 4.0000 mg | Freq: Three times a day (TID) | INTRAMUSCULAR | Status: DC | PRN

## 2013-11-13 MED ORDER — ZOLPIDEM TARTRATE 5 MG PO TABS: 5.0000 mg | ORAL_TABLET | Freq: Every evening | ORAL | Status: DC | PRN

## 2013-11-13 MED ORDER — ONDANSETRON HCL 4 MG PO TABS: 4.0000 mg | ORAL_TABLET | Freq: Four times a day (QID) | ORAL | Status: DC | PRN

## 2013-11-13 MED ORDER — ACETAMINOPHEN 650 MG RE SUPP: 650.0000 mg | Freq: Four times a day (QID) | RECTAL | Status: DC | PRN

## 2013-11-13 MED ORDER — LEVOFLOXACIN IN D5W 500 MG/100ML IV SOLN
500.0000 mg | INTRAVENOUS | Status: DC
  Administered 2013-11-14: 500 mg via INTRAVENOUS
  Filled 2013-11-13 (×2): qty 100

## 2013-11-13 MED ORDER — METOPROLOL TARTRATE 1 MG/ML IV SOLN
2.5000 mg | Freq: Three times a day (TID) | INTRAVENOUS | Status: DC | PRN
  Administered 2013-11-13: 2.5 mg via INTRAVENOUS
  Filled 2013-11-13: qty 5

## 2013-11-13 MED ORDER — POTASSIUM CHLORIDE IN NACL 20-0.9 MEQ/L-% IV SOLN
INTRAVENOUS | Status: DC
  Administered 2013-11-13: 20:00:00 via INTRAVENOUS
  Administered 2013-11-14: 75 mL via INTRAVENOUS
  Administered 2013-11-15: 02:00:00 via INTRAVENOUS
  Filled 2013-11-13 (×5): qty 1000

## 2013-11-13 MED ORDER — SODIUM CHLORIDE 0.9 % IV SOLN
INTRAVENOUS | Status: AC
  Administered 2013-11-13: 21:00:00 via INTRAVENOUS

## 2013-11-13 MED ORDER — POTASSIUM CHLORIDE 10 MEQ/100ML IV SOLN
10.0000 meq | INTRAVENOUS | Status: AC
  Administered 2013-11-13 (×3): 10 meq via INTRAVENOUS
  Filled 2013-11-13 (×4): qty 100

## 2013-11-13 NOTE — ED Notes (Signed)
Per EMS- pt has had generalized weakness and confusion over the last 48hours. Pt was treated recently for UTI. Pt also noted to have HR 130-200. Pt has hx of anemia as well.

## 2013-11-13 NOTE — ED Notes (Signed)
Number given to receiving nurse to call for report.

## 2013-11-13 NOTE — ED Notes (Signed)
Admitting MD at bedside.

## 2013-11-13 NOTE — ED Provider Notes (Signed)
CSN: [id number]     Arrival date & time 11/13/13  1551 History   First MD Initiated Contact with Patient 11/13/13 1557     Chief Complaint  Patient presents with  . Altered Mental Status  . Tachycardia      HPI Family brought patient in with generalized weakness and some confusion over the last 48 hours.  Recently was treated for urinary tract infection.  Has had history of palpitations but no abnormal heart rhythm.  Patient has known history of anemia that is still being worked up.  No GI sources been found.  Had blood transfusion a few weeks ago for same. Past Medical History  Diagnosis Date  . History of blood transfusion 10/09/2013  . Heart murmur dx'd 10/09/2013  . Anemia     "just today" (10/09/2013)  . Migraine     "just when I was going thru the change of life"  . Arthritis     "knees" (10/09/2013)  . Stone, kidney    Past Surgical History  Procedure Laterality Date  . Appendectomy  1939  . Tonsillectomy  1941  . Total abdominal hysterectomy  1971  . Back surgery    . Thoracic disc surgery  1997    "[place]"  . Cholecystectomy  1983  . Excisional hemorrhoidectomy  1965  . Knee arthroscopy Bilateral ?2001  . Dilation and curettage of uterus  "several"  . Cataract extraction w/ intraocular lens  implant, bilateral Bilateral 2001  . Partial nephrectomy  1997  . Kidney cyst removal Right 1997 X 2    /notes 12/10/2008   No family history on file. History  Substance Use Topics  . Smoking status: Never Smoker   . Smokeless tobacco: Never Used  . Alcohol Use: No   OB History   Grav Para Term Preterm Abortions TAB SAB Ect Mult Living                 Review of Systems  Level V cavity  Allergies  Sulfa antibiotics and Penicillins  Home Medications   Prior to Admission medications   Medication Sig Start Date End Date Taking? Authorizing Provider  Ascorbic Acid (VITAMIN C) 1000 MG tablet Take 1,000 mg by mouth daily.   Yes Historical Provider, MD  b complex  vitamins tablet Take 1 tablet by mouth daily.   Yes Historical Provider, MD  beta carotene w/minerals (OCUVITE) tablet Take 1 tablet by mouth daily.   Yes Historical Provider, MD  Cholecalciferol (VITAMIN D-3) 1000 UNITS CAPS Take 1 capsule by mouth daily.   Yes Historical Provider, MD  Glucosamine-Chondroitin (OSTEO BI-FLEX REGULAR STRENGTH PO) Take 1 tablet by mouth 3 (three) times daily.   Yes Historical Provider, MD  losartan (COZAAR) 50 MG tablet Take 50 mg by mouth daily.   Yes Historical Provider, MD  vitamin B-12 (CYANOCOBALAMIN) 1000 MCG tablet Take 1,000 mcg by mouth daily.   Yes Historical Provider, MD   BP 125/61  Pulse 101  Temp(Src) 98.1 F (36.7 C) (Oral)  Resp 31  SpO2 99% Physical Exam Physical Exam  Nursing note and vitals reviewed. Constitutional: Patient appears ill.  She is oriented to person, place, and time. She appears well-developed and well-nourished.  HENT:  Head: Normocephalic and atraumatic.  Eyes: Pupils are equal, round, and reactive to light.  Neck: Normal range of motion.  Cardiovascular: Tachycardia and intact distal pulses.   Pulmonary/Chest: No respiratory distress.  Abdominal: Normal appearance. She exhibits no distension.  Musculoskeletal: Normal range of  motion.  Neurological: She is alert and oriented to person, place, and time. No cranial nerve deficit.  No focal or lateralizing neurologic findings.   Skin: Skin is warm and dry. No rash noted.  Skin is pale  Psychiatric: She has a normal mood and affect. Her behavior is normal.   ED Course  Procedures (including critical care time) Medications  sodium chloride 0.9 % bolus 500 mL (0 mLs Intravenous Stopped 11/13/13 1815)    CRITICAL CARE Performed by: Leonard Schwartz L Total critical care time: 30 min Critical care time was exclusive of separately billable procedures and treating other patients. Critical care was necessary to treat or prevent imminent or life-threatening  deterioration. Critical care was time spent personally by me on the following activities: development of treatment plan with patient and/or surrogate as well as nursing, discussions with consultants, evaluation of patient's response to treatment, examination of patient, obtaining history from patient or surrogate, ordering and performing treatments and interventions, ordering and review of laboratory studies, ordering and review of radiographic studies, pulse oximetry and re-evaluation of patient's condition.   Labs Review Labs Reviewed  CBC WITH DIFFERENTIAL - Abnormal; Notable for the following:    RBC 3.17 (*)    Hemoglobin 10.6 (*)    HCT 30.6 (*)    RDW 22.0 (*)    Neutrophils Relative % 28 (*)    Monocytes Relative 34 (*)    nRBC 3 (*)    Monocytes Absolute 3.0 (*)    All other components within normal limits  URINALYSIS, ROUTINE W REFLEX MICROSCOPIC - Abnormal; Notable for the following:    Color, Urine AMBER (*)    Hgb urine dipstick MODERATE (*)    Protein, ur 100 (*)    All other components within normal limits  BASIC METABOLIC PANEL - Abnormal; Notable for the following:    Sodium 131 (*)    Potassium 2.9 (*)    Chloride 87 (*)    Glucose, Bld 62 (*)    GFR calc non Af Amer 51 (*)    GFR calc Af Amer 59 (*)    Anion gap 25 (*)    All other components within normal limits  I-STAT CG4 LACTIC ACID, ED - Abnormal; Notable for the following:    Lactic Acid, Venous 4.23 (*)    All other components within normal limits  URINE CULTURE  CULTURE, BLOOD (ROUTINE X 2)  CULTURE, BLOOD (ROUTINE X 2)  CLOSTRIDIUM DIFFICILE BY PCR  URINE MICROSCOPIC-ADD ON    Imaging Review Dg Chest Portable 1 View  11/13/2013   CLINICAL DATA:  Short of breath  EXAM: PORTABLE CHEST - 1 VIEW  COMPARISON:  10/09/2013  FINDINGS: Cardiac enlargement without heart failure. Mild bibasilar atelectasis. Hiatal hernia. No effusion.  IMPRESSION: Mild bibasilar atelectasis  Hiatal hernia   Electronically  Signed   By: Franchot Gallo M.D.   On: 11/13/2013 17:47     EKG Interpretation  Date: 11/13/2013  Rate: 135  Rhythm: Atrial fibrillation with rapid ventricular response  QRS Axis: normal  Intervals: normal  ST/T Wave abnormalities: Nonspecific ST-T changes  Conduction Disutrbances: none  Narrative Interpretation: Abnormal EKG    Patient will be admitted to stepdown.  Patient is a DO NOT RESUSCITATE.    MDM   Final diagnoses:  Shortness of breath  Lactic acidosis  Atrial fibrillation with RVR  Dehydration        Dot Lanes, MD 11/13/13 [phone]

## 2013-11-13 NOTE — H&P (Signed)
[facility]                                                                                    Patient Demographics  Erika [name], is a 80 y.o. female  CSN: [id number]  MRN: [id number]  DOB - [birth year]  Admit Date - 11/13/2013  Outpatient Primary MD for the patient is Valaria Good, MD   With History of -  Past Medical History  Diagnosis Date  . History of blood transfusion 10/09/2013  . Heart murmur dx'd 10/09/2013  . Anemia     "just today" (10/09/2013)  . Migraine     "just when I was going thru the change of life"  . Arthritis     "knees" (10/09/2013)  . Stone, kidney       Past Surgical History  Procedure Laterality Date  . Appendectomy  1939  . Tonsillectomy  1941  . Total abdominal hysterectomy  1971  . Back surgery    . Thoracic disc surgery  1997    "[place]"  . Cholecystectomy  1983  . Excisional hemorrhoidectomy  1965  . Knee arthroscopy Bilateral ?2001  . Dilation and curettage of uterus  "several"  . Cataract extraction w/ intraocular lens  implant, bilateral Bilateral 2001  . Partial nephrectomy  1997  . Kidney cyst removal Right 1997 X 2    /notes 12/10/2008    in for   Chief Complaint  Patient presents with  . Altered Mental Status  . Tachycardia     HPI  Erika [name]  is a 80 y.o. female, with past medical history significant for anemia NOS, diastolic congestive heart failure, both by her family because of constipation and low back pain. Family discussed with family physician was advised to bring her to the hospital. No history of blood in her stools, fever nausea vomiting or diarrhea. In the emergency room. Patient was noted to be pale and tachycardic with a heart rate in the 150s. Patient could not give any history however she was noted to have diarrhea and C. difficile was ordered.    Review of Systems    Unable to obtain Social History History  Substance Use Topics  . Smoking status: Never Smoker   . Smokeless  tobacco: Never Used  . Alcohol Use: No     Family History Unable to obtain  Prior to Admission medications   Medication Sig Start Date End Date Taking? Authorizing Provider  Ascorbic Acid (VITAMIN C) 1000 MG tablet Take 1,000 mg by mouth daily.   Yes Historical Provider, MD  b complex vitamins tablet Take 1 tablet by mouth daily.   Yes Historical Provider, MD  beta carotene w/minerals (OCUVITE) tablet Take 1 tablet by mouth daily.   Yes Historical Provider, MD  Cholecalciferol (VITAMIN D-3) 1000 UNITS CAPS Take 1 capsule by mouth daily.   Yes Historical Provider, MD  Glucosamine-Chondroitin (OSTEO BI-FLEX REGULAR STRENGTH PO) Take 1 tablet by mouth 3 (three) times daily.   Yes Historical Provider, MD  losartan (COZAAR) 50 MG tablet Take 50 mg by mouth daily.   Yes Historical Provider, MD  vitamin B-12 (CYANOCOBALAMIN) 1000 MCG tablet Take  1,000 mcg by mouth daily.   Yes Historical Provider, MD    Allergies  Allergen Reactions  . Sulfa Antibiotics Other (See Comments)    unknown  . Penicillins Rash    Pt was on penicillin for 6 weeks in 1959 and developed a rash towards the latter stages of treatment    Physical Exam  Vitals  Blood pressure 108/61, pulse 99, temperature 100.7 F (38.2 C), temperature source Oral, resp. rate 12, SpO2 99.00%.   1. General elderly female obtunded in bed,  2. Confused, lethargic.  3. No F.N deficits, ALL C.Nerves Intact .  4. Ears and Eyes appear Normal, Conjunctivae clear, PERRLA. Moist Oral Mucosa.  5. Supple Neck, No JVD, No cervical lymphadenopathy appriciated, No Carotid Bruits.  6. Symmetrical Chest wall movement, decreased breath sounds bilaterally.  7. Irregular, tachycardic, systolic ejection murmur heard, No Parasternal Heave.  8. Positive Bowel Sounds, Abdomen Soft, Non tender, No organomegaly appriciated,No rebound -guarding or rigidity.  9.  No Cyanosis, decreased Skin Turgor, a.  10. Decreased muscle tone,  joints appear  normal , no effusions,       Data Review  CBC  Recent Labs Lab 11/13/13 1559  WBC 8.9  HGB 10.6*  HCT 30.6*  PLT 238  MCV 96.5  MCH 33.4  MCHC 34.6  RDW 22.0*  LYMPHSABS 3.4  MONOABS 3.0*  EOSABS 0.0  BASOSABS 0.0   ------------------------------------------------------------------------------------------------------------------  Chemistries   Recent Labs Lab 11/13/13 1600  NA 131*  K 2.9*  CL 87*  CO2 19  GLUCOSE 62*  BUN 23  CREATININE 0.94  CALCIUM 9.4   ------------------------------------------------------------------------------------------------------------------ CrCl is unknown because both a height and weight (above a minimum accepted value) are required for this calculation. ------------------------------------------------------------------------------------------------------------------ No results found for this basename: TSH, T4TOTAL, FREET3, T3FREE, THYROIDAB,  in the last 72 hours   Coagulation profile No results found for this basename: INR, PROTIME,  in the last 168 hours ------------------------------------------------------------------------------------------------------------------- No results found for this basename: DDIMER,  in the last 72 hours -------------------------------------------------------------------------------------------------------------------  Cardiac Enzymes No results found for this basename: CK, CKMB, TROPONINI, MYOGLOBIN,  in the last 168 hours ------------------------------------------------------------------------------------------------------------------ No components found with this basename: POCBNP,    ---------------------------------------------------------------------------------------------------------------  Urinalysis    Component Value Date/Time   COLORURINE AMBER* 11/13/2013 [place] 11/13/2013 1733   LABSPEC 1.015 11/13/2013 1733   LABSPEC 1.010 11/02/2013 1451   PHURINE 6.5  11/13/2013 [place] 11/13/2013 1733   GLUCOSEU Negative 11/02/2013 1451   HGBUR MODERATE* 11/13/2013 [place] 11/13/2013 [place] 11/13/2013 1733   PROTEINUR 100* 11/13/2013 1733   UROBILINOGEN 0.2 11/13/2013 1733   UROBILINOGEN 0.2 11/02/2013 1451   NITRITE NEGATIVE 11/13/2013 1733   LEUKOCYTESUR NEGATIVE 11/13/2013 1733    Imaging results:   Dg Chest Portable 1 View  11/13/2013   CLINICAL DATA:  Short of breath  EXAM: PORTABLE CHEST - 1 VIEW  COMPARISON:  10/09/2013  FINDINGS: Cardiac enlargement without heart failure. Mild bibasilar atelectasis. Hiatal hernia. No effusion.  IMPRESSION: Mild bibasilar atelectasis  Hiatal hernia   Electronically Signed   By: Franchot Gallo M.D.   On: 11/13/2013 17:47    My personal review of EKG: AFib w RVR , PVC, LAD , old ASMI , nonspecific ST changes    Assessment & Plan  1. Atrial fibrillation with rapid ventricular rate, new onset    Probably caused by dehydration    Check TSH, echo  done on 9 2015 shows grade 1 diastolic congestive heart failure and normal ejection fraction    IV fluids    Beta blockers when necessary  2. Electrolyte abnormalities     Replete  3. Diarrhea     Check C. Difficile  4. Dehydration with elevated lactic acid    IV fluids    Check blood cultures    IV Levaquin pending cultures       DVT Prophylaxis SCDs  AM Labs Ordered, also please review Full Orders  Code Status DO NOT RESUSCITATE  Disposition Plan: Unknown  Time spent in minutes : 38 minutes  Condition GUARDED   @SIGNATURE @

## 2013-11-13 NOTE — ED Notes (Signed)
NOTIFIED DR. BEATON IN PERSON FOR PATIENTS PANIC LAB RESULTS OF CG4+ LACTIC ACID = 4.23 mmol/L@16 :40 PM ,11/14/2013.

## 2013-11-13 NOTE — ED Notes (Signed)
Unable to transfer call so my number is to be given to the receiving nurse to call for report.

## 2013-11-13 NOTE — ED Notes (Signed)
MD at bedsdie

## 2013-11-14 ENCOUNTER — Other Ambulatory Visit: Payer: Self-pay

## 2013-11-14 DIAGNOSIS — E440 Moderate protein-calorie malnutrition: Secondary | ICD-10-CM | POA: Diagnosis present

## 2013-11-14 DIAGNOSIS — R627 Adult failure to thrive: Secondary | ICD-10-CM | POA: Diagnosis present

## 2013-11-14 DIAGNOSIS — G9340 Encephalopathy, unspecified: Secondary | ICD-10-CM | POA: Diagnosis present

## 2013-11-14 DIAGNOSIS — R7989 Other specified abnormal findings of blood chemistry: Secondary | ICD-10-CM | POA: Diagnosis present

## 2013-11-14 DIAGNOSIS — E860 Dehydration: Secondary | ICD-10-CM | POA: Diagnosis present

## 2013-11-14 DIAGNOSIS — R8290 Unspecified abnormal findings in urine: Secondary | ICD-10-CM | POA: Diagnosis present

## 2013-11-14 LAB — CBC
HCT: 24.9 % — ABNORMAL LOW (ref 36.0–46.0)
HEMOGLOBIN: 8.5 g/dL — AB (ref 12.0–15.0)
MCH: 33.6 pg (ref 26.0–34.0)
MCHC: 34.1 g/dL (ref 30.0–36.0)
MCV: 98.4 fL (ref 78.0–100.0)
Platelets: 179 10*3/uL (ref 150–400)
RBC: 2.53 MIL/uL — AB (ref 3.87–5.11)
RDW: 22.2 % — ABNORMAL HIGH (ref 11.5–15.5)
WBC: 10.9 10*3/uL — AB (ref 4.0–10.5)

## 2013-11-14 LAB — BASIC METABOLIC PANEL
ANION GAP: 16 — AB (ref 5–15)
BUN: 28 mg/dL — ABNORMAL HIGH (ref 6–23)
CHLORIDE: 90 meq/L — AB (ref 96–112)
CO2: 23 mEq/L (ref 19–32)
Calcium: 8 mg/dL — ABNORMAL LOW (ref 8.4–10.5)
Creatinine, Ser: 1.23 mg/dL — ABNORMAL HIGH (ref 0.50–1.10)
GFR calc non Af Amer: 37 mL/min — ABNORMAL LOW (ref >90)
GFR, EST AFRICAN AMERICAN: 43 mL/min — AB (ref >90)
Glucose, Bld: 74 mg/dL (ref 70–99)
POTASSIUM: 3.3 meq/L — AB (ref 3.7–5.3)
Sodium: 129 mEq/L — ABNORMAL LOW (ref 137–147)

## 2013-11-14 LAB — GLUCOSE, CAPILLARY: Glucose-Capillary: 113 mg/dL — ABNORMAL HIGH (ref 70–99)

## 2013-11-14 LAB — LACTIC ACID, PLASMA: LACTIC ACID, VENOUS: 1.8 mmol/L (ref 0.5–2.2)

## 2013-11-14 LAB — TROPONIN I
TROPONIN I: 1.05 ng/mL — AB (ref <0.30)
Troponin I: 0.7 ng/mL (ref <0.30)

## 2013-11-14 LAB — CLOSTRIDIUM DIFFICILE BY PCR: Toxigenic C. Difficile by PCR: NEGATIVE

## 2013-11-14 LAB — TSH: TSH: 5.66 u[IU]/mL — AB (ref 0.350–4.500)

## 2013-11-14 MED ORDER — ENSURE COMPLETE PO LIQD
237.0000 mL | Freq: Three times a day (TID) | ORAL | Status: DC
  Administered 2013-11-14 – 2013-11-19 (×10): 237 mL via ORAL

## 2013-11-14 MED ORDER — B COMPLEX-C PO TABS
1.0000 | ORAL_TABLET | Freq: Every day | ORAL | Status: DC
  Administered 2013-11-14 – 2013-11-19 (×6): 1 via ORAL
  Filled 2013-11-14 (×6): qty 1

## 2013-11-14 MED ORDER — VITAMIN D3 25 MCG (1000 UNIT) PO TABS
1000.0000 [IU] | ORAL_TABLET | Freq: Every day | ORAL | Status: DC
  Administered 2013-11-14 – 2013-11-19 (×6): 1000 [IU] via ORAL
  Filled 2013-11-14 (×6): qty 1

## 2013-11-14 MED ORDER — OCUVITE PO TABS
1.0000 | ORAL_TABLET | Freq: Every day | ORAL | Status: DC
  Administered 2013-11-14 – 2013-11-19 (×6): 1 via ORAL
  Filled 2013-11-14 (×6): qty 1

## 2013-11-14 MED ORDER — POTASSIUM CHLORIDE 10 MEQ/100ML IV SOLN
10.0000 meq | INTRAVENOUS | Status: AC
  Administered 2013-11-14 (×3): 10 meq via INTRAVENOUS
  Filled 2013-11-14 (×2): qty 100

## 2013-11-14 MED ORDER — SODIUM CHLORIDE 0.9 % IV BOLUS (SEPSIS)
500.0000 mL | Freq: Once | INTRAVENOUS | Status: AC
  Administered 2013-11-14: 500 mL via INTRAVENOUS

## 2013-11-14 MED ORDER — VITAMIN D-3 25 MCG (1000 UT) PO CAPS: 1.0000 | ORAL_CAPSULE | Freq: Every day | ORAL | Status: DC

## 2013-11-14 MED ORDER — VITAMIN C 500 MG PO TABS
1000.0000 mg | ORAL_TABLET | Freq: Every day | ORAL | Status: DC
  Administered 2013-11-14 – 2013-11-19 (×6): 1000 mg via ORAL
  Filled 2013-11-14 (×6): qty 2

## 2013-11-14 MED ORDER — MEGESTROL ACETATE 400 MG/10ML PO SUSP
800.0000 mg | Freq: Every day | ORAL | Status: DC
  Administered 2013-11-14 – 2013-11-19 (×6): 800 mg via ORAL
  Filled 2013-11-14 (×6): qty 20

## 2013-11-14 MED ORDER — B COMPLEX PO TABS: 1.0000 | ORAL_TABLET | Freq: Every day | ORAL | Status: DC

## 2013-11-14 MED ORDER — VITAMIN B-12 1000 MCG PO TABS
1000.0000 ug | ORAL_TABLET | Freq: Every day | ORAL | Status: DC
  Administered 2013-11-14 – 2013-11-19 (×6): 1000 ug via ORAL
  Filled 2013-11-14 (×6): qty 1

## 2013-11-14 MED ORDER — ASPIRIN 300 MG RE SUPP
300.0000 mg | Freq: Once | RECTAL | Status: AC
  Administered 2013-11-14: 300 mg via RECTAL
  Filled 2013-11-14: qty 1

## 2013-11-14 NOTE — Progress Notes (Signed)
PT Cancellation Note  Patient Details Name: Erika [name] MRN: [id number] DOB: 1919-07-11   Cancelled Treatment:    Reason Eval/Treat Not Completed: Patient not medically ready (per MD Thereasa Solo) hold therpies today)   Duncan Dull 11/14/2013, 2:10 PM Alben Deeds, [place] DPT  [phone]

## 2013-11-14 NOTE — Progress Notes (Addendum)
Troponin came back elevated at 2.26. Pt was admitted earlier with new onset Afib with RVR and lactic acidosis. She is sleepy, but denies any chest pain. She is a DNR. 2nd EKG is without acute changes and this NP doesn't see any ST depression. Therefore, this NP called pt's son who is the POA to discuss situation. I explained troponin being elevated in absence of chest pain and EKG changes could be coming from demand with Afib or lactic acidosis. Son is OK with waiting on next troponin before taking any action. Ms Preiss is 64, a DNR, and would not be a candidate for invasive procedure such as catheterization or stenting. Will follow and speak to family after next troponin. Update: next troponin trended down to 1.05. Given no acute EKG changes, will just continue to follow troponins.

## 2013-11-14 NOTE — Progress Notes (Signed)
INITIAL NUTRITION ASSESSMENT  DOCUMENTATION CODES Per approved criteria  -Non-severe (moderate) malnutrition in the context of chronic illness   Pt meets criteria for non-severe (moderate) MALNUTRITION in the context of chronic as evidenced by mild/moderate depletion of muscle mass with 7.5% weight loss within 2-3 months.  INTERVENTION:  Recommend liberalize diet to regular. Ensure Complete PO TID, each supplement provides 350 kcal and 13 grams of protein  NUTRITION DIAGNOSIS: Malnutrition related to inadequate oral intake as evidenced by mild/moderate depletion of muscle mass with 7.5% weight loss within 2-3 months.   Goal: Intake to meet >90% of estimated nutrition needs.  Monitor:  PO intake, labs, weight trend.  Reason for Assessment: MST  80 y.o. female  Admitting Dx: Constipation and back pain  ASSESSMENT: 80 y.o. female, with past medical history significant for anemia NOS, diastolic congestive heart failure, both by her family because of constipation and low back pain.  Two sons in room with patient report that patient has a chronic issue with constipation that has recently gotten worse. She has been eating very poorly for the past few weeks. They just started giving her Ensure yesterday, willing to try it to increase intake. Discussed dietary options for helping prevent constipation.  Nutrition Focused Physical Exam:  Subcutaneous Fat:  Orbital Region: WNL Upper Arm Region: WNL Thoracic and Lumbar Region: NA  Muscle:  Temple Region: mild depletion Clavicle Bone Region: moderate depletion Clavicle and Acromion Bone Region: moderate depletion Scapular Bone Region: NA Dorsal Hand: NA Patellar Region: WNL Anterior Thigh Region: mild depletion Posterior Calf Region: WNL  Edema: none   Height: Ht Readings from Last 1 Encounters:  11/13/13 5\' 3"  (1.6 m)    Weight: Wt Readings from Last 1 Encounters:  11/13/13 148 lb 12.8 oz (67.495 kg)    Ideal Body  Weight: 52.3 kg  % Ideal Body Weight: 129%  Wt Readings from Last 10 Encounters:  11/13/13 148 lb 12.8 oz (67.495 kg)  11/02/13 153 lb 9.6 oz (69.673 kg)  10/11/13 152 lb 14.4 oz (69.355 kg)    Usual Body Weight: 160 lb 2 months ago  % Usual Body Weight: 92.5%  BMI:  Body mass index is 26.37 kg/(m^2).  Estimated Nutritional Needs: Kcal: 1350-1550 Protein: 70-80 gm Fluid: 1.4-1.6 L  Skin: WDL  Diet Order: Cardiac  EDUCATION NEEDS: -Education needs addressed   Intake/Output Summary (Last 24 hours) at 11/14/13 0930 Last data filed at 11/14/13 0910  Gross per 24 hour  Intake   1925 ml  Output    450 ml  Net   1475 ml    Last BM: 10/27   Labs:   Recent Labs Lab 11/13/13 1600 11/14/13 0253  NA 131* 129*  K 2.9* 3.3*  CL 87* 90*  CO2 19 23  BUN 23 28*  CREATININE 0.94 1.23*  CALCIUM 9.4 8.0*  GLUCOSE 62* 74    CBG (last 3)  No results found for this basename: GLUCAP,  in the last 72 hours  Scheduled Meds: . levofloxacin (LEVAQUIN) IV  500 mg Intravenous Q48H  . sodium chloride  3 mL Intravenous Q12H    Continuous Infusions: . 0.9 % NaCl with KCl 20 mEq / L 75 mL (11/14/13 0910)    Past Medical History  Diagnosis Date  . History of blood transfusion 10/09/2013  . Heart murmur dx'd 10/09/2013  . Anemia     "just today" (10/09/2013)  . Migraine     "just when I was going thru the change of  life"  . Arthritis     "knees" (10/09/2013)  . Stone, kidney     Past Surgical History  Procedure Laterality Date  . Appendectomy  1939  . Tonsillectomy  1941  . Total abdominal hysterectomy  1971  . Back surgery    . Thoracic disc surgery  1997    "[place]"  . Cholecystectomy  1983  . Excisional hemorrhoidectomy  1965  . Knee arthroscopy Bilateral ?2001  . Dilation and curettage of uterus  "several"  . Cataract extraction w/ intraocular lens  implant, bilateral Bilateral 2001  . Partial nephrectomy  1997  . Kidney cyst removal Right 1997 X 2     /notes 12/10/2008    Molli Barrows, RD, LDN, [facility] Pager [phone] After Hours Pager [phone]

## 2013-11-14 NOTE — Progress Notes (Signed)
[facility]  SAMANVITHA GERMANY WLN:[id number] DOB: 1919/03/27 DOA: 11/13/2013 PCP: Valaria Good, MD   Brief narrative: 80 year old patient with known anemia suspected to be due to MDS, and diastolic congestive heart failure who was brought by her family to the hospital because of altered mentation.  After presentation was noted to be pale and tachycardic with heart rate in the 150s. She was noted to have diarrhea so C. difficile toxin was obtained. Her TNI was elevated at 2.26. Lactic acid was elevated at 4.23. She was not having any chest pain and her EKG was unremarkable. Her sodium and potassium were low and her BUN was mildly elevated at 23 with creatinine of 0.94 near baseline (0.77).  Chart review since admission reveals patient previously admitted September this year with anemia requiring transfusion (total of 3 units packed red blood cells). An extensive workup was obtained without any source of anemia identified. She was Hemoccult negative. Since that time she has followed up with hematologist Dr. Lindi Adie who surmised the etiology to the patient's anemia was related to myelodysplastic syndrome. Since discharge she has received an additional 2 units of packed red blood cells on 10/17. At her next outpatient hematology visit she was scheduled to begin Aranesp. Since discharge she is also followed up with her primary care physician who stopped her Cozaar because of hypotension on 10/15. Patient's son says that over the past few weeks patient has had very poor appetite with poor intake and has lost about 15 pounds. He also reports patient having issues with progressive short-term memory loss.  HPI/Subjective: More alert today in talking with her son although still seems somewhat sleepy.  Assessment/Plan:    Symptomatic anemia/Myelodysplastic syndrome Hemoglobin at presentation around 10 but has decreased to 8 after hydration which appears to be  patient's current baseline posttransfusion-give first dose of Aranesp while inpatient-follow CBC    Atrial fibrillation with RVR Related to dehydration as well as general physiologic stress-currently has resolved/reverted back to sinus rhythm-given advanced age and underlying myelodysplastic syndrome not an appropriate candidate for long-term anticoagulation    Acute encephalopathy Resolving-may have underlying undiagnosed dementia    Elevated troponin c/w demand ischemia further worsened by RVR which also decreased perfusion-trend is down    Lactic acidosis /Dehydration with hyponatremia Secondary to poor intake prior to admission-presented with hypotension and elevated lactic acid as well as hyponatremia-noted patient's Cozaar was discontinued on 10/15 by primary care physician     Abnormal urinalysis Could be reflective of dehydration along-has had multiple abnormal urinalysis in the past few months with normal or inconclusive urine cultures-follow-up on urine culture-continue empiric antibiotic    Malnutrition of moderate degree/FTT (failure to thrive) in adult PT/OT evaluation this admission once volume restored and afib remains controlled/resovled -continue Ensure supplements    Elevated TSH Likely sick euthyroid syndrome-check free T4   Diarrhea Patient with issues of constipation prior to admission-was given multiple laxatives prior to admission and subsequently had diarrhea afterwards-C. difficile negative   DVT prophylaxis: SCDs Code Status: DO NOT RESUSCITATE Family Communication: Extensive conversation with son at bedside Disposition Plan/Expected LOS: Stepdown   Consultants: Hematology  Procedures: None  Antibiotics: Levaquin 10/27>  Objective: Blood pressure 107/45, pulse 91, temperature 99.3 F (37.4 C), temperature source Oral, resp. rate 26, height 5\' 3"  (1.6 m), weight 148 lb 12.8 oz (67.495 kg), SpO2 100.00%.  Intake/Output Summary (Last 24 hours) at  11/14/13 1513 Last data filed at 11/14/13  8119  Gross per 24 hour  Intake   1925 ml  Output    450 ml  Net   1475 ml    Exam: Gen: No acute respiratory distress-sleepy but communicating appropriately although mildly confused Chest: Clear to auscultation bilaterally without wheezes, rhonchi or crackles, 2 L Cardiac: Regular rate and rhythm, S1-S2, no rubs murmurs or gallops Abdomen: Soft nontender nondistended without obvious hepatosplenomegaly, no ascites Extremities: no signif c/c/edema B LE  Scheduled Meds:  Scheduled Meds: . feeding supplement (ENSURE COMPLETE)  237 mL Oral TID BM  . levofloxacin (LEVAQUIN) IV  500 mg Intravenous Q48H  . sodium chloride  3 mL Intravenous Q12H   Data Reviewed: Basic Metabolic Panel:  Recent Labs Lab 11/13/13 1600 11/14/13 0253  NA 131* 129*  K 2.9* 3.3*  CL 87* 90*  CO2 19 23  GLUCOSE 62* 74  BUN 23 28*  CREATININE 0.94 1.23*  CALCIUM 9.4 8.0*   Liver Function Tests: No results found for this basename: AST, ALT, ALKPHOS, BILITOT, PROT, ALBUMIN,  in the last 168 hours  CBC:  Recent Labs Lab 11/13/13 1559 11/14/13 0253  WBC 8.9 10.9*  NEUTROABS 2.5  --   HGB 10.6* 8.5*  HCT 30.6* 24.9*  MCV 96.5 98.4  PLT 238 179   Cardiac Enzymes:  Recent Labs Lab 11/13/13 2246 11/14/13 0253 11/14/13 0946  TROPONINI 2.26* 1.05* 0.70*   BNP (last 3 results)  Recent Labs  10/09/13 1251  PROBNP 1194.0*    Recent Results (from the past 240 hour(s))  CULTURE, BLOOD (ROUTINE X 2)     Status: None   Collection Time    11/13/13  4:00 PM      Result Value Ref Range Status   Specimen Description BLOOD ARM LEFT   Final   Special Requests BOTTLES DRAWN AEROBIC AND ANAEROBIC 10CC   Final   Culture  Setup Time     Final   Value: 11/13/2013 22:38     Performed at [place]     Final   Value:        BLOOD CULTURE RECEIVED NO GROWTH TO DATE CULTURE WILL BE HELD FOR 5 DAYS BEFORE ISSUING A FINAL NEGATIVE REPORT      Performed at [facility]   Report Status PENDING   Incomplete  CULTURE, BLOOD (ROUTINE X 2)     Status: None   Collection Time    11/13/13  4:08 PM      Result Value Ref Range Status   Specimen Description BLOOD ARM RIGHT   Final   Special Requests BOTTLES DRAWN AEROBIC AND ANAEROBIC 5CC   Final   Culture  Setup Time     Final   Value: 11/13/2013 22:37     Performed at [facility]     Final   Value:        BLOOD CULTURE RECEIVED NO GROWTH TO DATE CULTURE WILL BE HELD FOR 5 DAYS BEFORE ISSUING A FINAL NEGATIVE REPORT     Performed at [facility]   Report Status PENDING   Incomplete  CLOSTRIDIUM DIFFICILE BY PCR     Status: None   Collection Time    11/13/13  6:17 PM      Result Value Ref Range Status   C difficile by pcr NEGATIVE  NEGATIVE Final  MRSA PCR SCREENING     Status: None   Collection Time    11/13/13  9:16 PM  Result Value Ref Range Status   MRSA by PCR NEGATIVE  NEGATIVE Final   Comment:            The GeneXpert MRSA Assay (FDA     approved for NASAL specimens     only), is one component of a     comprehensive MRSA colonization     surveillance program. It is not     intended to diagnose MRSA     infection nor to guide or     monitor treatment for     MRSA infections.     Studies:  Recent x-ray studies have been reviewed in detail by the Attending Physician  Time spent :  [place], ANP [facility] Office  [phone] Pager [phone]  **If unable to reach the above provider after paging please contact the [place] @ [phone]  On-Call/Text Page:      [email or web address]      password [email or web address]  If 7PM-7AM, please contact night-coverage [email or web address] Password [email or web address] 11/14/2013, 3:13 PM   LOS: 1 day   I have personally examined this patient and reviewed the entire database. I have reviewed the above note, made any necessary editorial changes, and agree with its content.  Cherene Altes,  MD [facility]

## 2013-11-14 NOTE — Progress Notes (Addendum)
CRITICAL VALUE ALERT  Critical value received:  Troponin 2.26  Date of notification:  11/14/13  Time of notification:  0002  Critical value read back:Yes.    Nurse who received alert:  S.Young,RN  MD notified (1st page):  Josephine Cables  Time of first page:  0005  MD notified (2nd page):  Time of second page:  Responding MD: Katheran Awe  Time MD responded:  [phone]

## 2013-11-14 NOTE — Progress Notes (Signed)
Utilization review completed.  

## 2013-11-15 DIAGNOSIS — R197 Diarrhea, unspecified: Secondary | ICD-10-CM

## 2013-11-15 DIAGNOSIS — D649 Anemia, unspecified: Secondary | ICD-10-CM

## 2013-11-15 LAB — CBC
HCT: 23.2 % — ABNORMAL LOW (ref 36.0–46.0)
HEMATOCRIT: 20.6 % — AB (ref 36.0–46.0)
HEMOGLOBIN: 7.8 g/dL — AB (ref 12.0–15.0)
Hemoglobin: 6.8 g/dL — CL (ref 12.0–15.0)
MCH: 32.9 pg (ref 26.0–34.0)
MCH: 32.9 pg (ref 26.0–34.0)
MCHC: 33.6 g/dL (ref 30.0–36.0)
MCHC: 33 g/dL (ref 30.0–36.0)
MCV: 97.9 fL (ref 78.0–100.0)
MCV: 99.5 fL (ref 78.0–100.0)
PLATELETS: 155 10*3/uL (ref 150–400)
Platelets: 155 10*3/uL (ref 150–400)
RBC: 2.37 MIL/uL — ABNORMAL LOW (ref 3.87–5.11)
RBC: 2.07 MIL/uL — ABNORMAL LOW (ref 3.87–5.11)
RDW: 22.6 % — ABNORMAL HIGH (ref 11.5–15.5)
RDW: 22.5 % — AB (ref 11.5–15.5)
WBC: 6.3 10*3/uL (ref 4.0–10.5)
WBC: 4.5 10*3/uL (ref 4.0–10.5)

## 2013-11-15 LAB — RETICULOCYTES
RBC.: 2.07 MIL/uL — ABNORMAL LOW (ref 3.87–5.11)
Retic Count, Absolute: 33.1 10*3/uL (ref 19.0–186.0)
Retic Ct Pct: 1.6 % (ref 0.4–3.1)

## 2013-11-15 LAB — COMPREHENSIVE METABOLIC PANEL
ALT: 17 U/L (ref 0–35)
AST: 20 U/L (ref 0–37)
Albumin: 2.3 g/dL — ABNORMAL LOW (ref 3.5–5.2)
Alkaline Phosphatase: 74 U/L (ref 39–117)
Anion gap: 11 (ref 5–15)
BUN: 21 mg/dL (ref 6–23)
CO2: 24 mEq/L (ref 19–32)
Calcium: 8.2 mg/dL — ABNORMAL LOW (ref 8.4–10.5)
Chloride: 99 mEq/L (ref 96–112)
Creatinine, Ser: 0.83 mg/dL (ref 0.50–1.10)
GFR calc Af Amer: 69 mL/min — ABNORMAL LOW (ref >90)
GFR calc non Af Amer: 59 mL/min — ABNORMAL LOW (ref >90)
GLUCOSE: 104 mg/dL — AB (ref 70–99)
Potassium: 3.7 mEq/L (ref 3.7–5.3)
SODIUM: 134 meq/L — AB (ref 137–147)
Total Bilirubin: 0.4 mg/dL (ref 0.3–1.2)
Total Protein: 5.7 g/dL — ABNORMAL LOW (ref 6.0–8.3)

## 2013-11-15 LAB — URINE CULTURE
COLONY COUNT: NO GROWTH
CULTURE: NO GROWTH

## 2013-11-15 LAB — PREPARE RBC (CROSSMATCH)

## 2013-11-15 LAB — IRON AND TIBC
IRON: 45 ug/dL (ref 42–135)
Saturation Ratios: 36 % (ref 20–55)
TIBC: 126 ug/dL — AB (ref 250–470)
UIBC: 81 ug/dL — AB (ref 125–400)

## 2013-11-15 LAB — T4, FREE: Free T4: 1.15 ng/dL (ref 0.80–1.80)

## 2013-11-15 MED ORDER — DARBEPOETIN ALFA-POLYSORBATE 40 MCG/0.4ML IJ SOLN
40.0000 ug | INTRAMUSCULAR | Status: DC
  Administered 2013-11-15: 40 ug via SUBCUTANEOUS
  Filled 2013-11-15: qty 0.4

## 2013-11-15 MED ORDER — DOCUSATE SODIUM 100 MG PO CAPS
100.0000 mg | ORAL_CAPSULE | Freq: Two times a day (BID) | ORAL | Status: DC
  Administered 2013-11-15 – 2013-11-19 (×7): 100 mg via ORAL
  Filled 2013-11-15 (×10): qty 1

## 2013-11-15 MED ORDER — SODIUM CHLORIDE 0.9 % IV SOLN: Freq: Once | INTRAVENOUS | Status: DC

## 2013-11-15 NOTE — Evaluation (Signed)
Reviewed and agree with assessment and POC.  Ayansh Feutz, PT DPT  [phone]  

## 2013-11-15 NOTE — Consult Note (Signed)
[place] NOTE  Patient Care Team: Valaria Good, MD as PCP - General (Internal Medicine)  CHIEF COMPLAINTS/PURPOSE OF CONSULTATION:  Macrocytic anemia, myelodysplastic syndrome with refractory anemia  HISTORY OF PRESENTING ILLNESS:  Erika [name] 80 y.o. female who developed multiple episodes of worsening anemia and had seen her in the outpatient clinic for this reason. Given that she was frail 80 year old, we elected not to do a bone marrow biopsy and it clinically diagnosed her with myelodysplastic syndrome. She had previously received blood transfusions for hemoglobin of 6.3 on 11/02/2013. She was admitted to the hospital because she had episode of profound diarrhea after she used an enema for constipation. When she was brought into the hospital she had a hemoglobin of 10.6 but after hydration it has slowly to go down to 6.8. I recommended out patient Aranesp treatment. We are consulted to see if she can be started on erythropoietin stimulating agent while she is still in the hospital.  I reviewed her records extensively and collaborated the history with the patient.  MEDICAL HISTORY:  Past Medical History  Diagnosis Date  . History of blood transfusion 10/09/2013  . Heart murmur dx'd 10/09/2013  . Anemia     "just today" (10/09/2013)  . Migraine     "just when I was going thru the change of life"  . Arthritis     "knees" (10/09/2013)  . Stone, kidney     SURGICAL HISTORY: Past Surgical History  Procedure Laterality Date  . Appendectomy  1939  . Tonsillectomy  1941  . Total abdominal hysterectomy  1971  . Back surgery    . Thoracic disc surgery  1997    "[place]"  . Cholecystectomy  1983  . Excisional hemorrhoidectomy  1965  . Knee arthroscopy Bilateral ?2001  . Dilation and curettage of uterus  "several"  . Cataract extraction w/ intraocular lens  implant, bilateral Bilateral 2001  . Partial nephrectomy  1997  . Kidney cyst removal Right 1997 X  2    /notes 12/10/2008    SOCIAL HISTORY: History   Social History  . Marital Status: Widowed    Spouse Name: N/A    Number of Children: N/A  . Years of Education: N/A   Occupational History  . Not on file.   Social History Main Topics  . Smoking status: Never Smoker   . Smokeless tobacco: Never Used  . Alcohol Use: No  . Drug Use: No  . Sexual Activity: No   Other Topics Concern  . Not on file   Social History Narrative  . No narrative on file    FAMILY HISTORY: No family history on file.  ALLERGIES:  is allergic to sulfa antibiotics and penicillins.  MEDICATIONS:  Current Facility-Administered Medications  Medication Dose Route Frequency Provider Last Rate Last Dose  . 0.9 %  sodium chloride infusion   Intravenous Once Samella Parr, NP      . acetaminophen (TYLENOL) tablet 650 mg  650 mg Oral Q6H PRN Merton Border, MD   650 mg at 11/14/13 2028   Or  . acetaminophen (TYLENOL) suppository 650 mg  650 mg Rectal Q6H PRN Merton Border, MD      . B-complex with vitamin C tablet 1 tablet  1 tablet Oral Daily Cherene Altes, MD   1 tablet at 11/15/13 [phone]  . beta carotene w/minerals (OCUVITE) tablet 1 tablet  1 tablet Oral Daily Cherene Altes, MD   1 tablet at  11/15/13 0626  . cholecalciferol (VITAMIN D) tablet 1,000 Units  1,000 Units Oral Daily Cherene Altes, MD   1,000 Units at 11/15/13 [phone]  . darbepoetin (ARANESP) injection 40 mcg  40 mcg Subcutaneous Weekly Kendra P Hiatt, RPH      . docusate sodium (COLACE) capsule 100 mg  100 mg Oral BID Samella Parr, NP   100 mg at 11/15/13 1047  . feeding supplement (ENSURE COMPLETE) (ENSURE COMPLETE) liquid 237 mL  237 mL Oral TID BM Dalene Carrow, RD   237 mL at 11/15/13 1000  . HYDROcodone-acetaminophen (NORCO/VICODIN) 5-325 MG per tablet 1-2 tablet  1-2 tablet Oral Q4H PRN Merton Border, MD   1 tablet at 11/15/13 1649  . megestrol (MEGACE) 400 MG/10ML suspension 800 mg  800 mg Oral Daily Cherene Altes,  MD   800 mg at 11/15/13 0919  . metoprolol (LOPRESSOR) injection 2.5 mg  2.5 mg Intravenous Q8H PRN Merton Border, MD   2.5 mg at 11/13/13 1913  . ondansetron (ZOFRAN) tablet 4 mg  4 mg Oral Q6H PRN Merton Border, MD       Or  . ondansetron [facility]) injection 4 mg  4 mg Intravenous Q6H PRN Merton Border, MD      . sodium chloride 0.9 % injection 3 mL  3 mL Intravenous Q12H Merton Border, MD   3 mL at 11/15/13 0930  . vitamin B-12 (CYANOCOBALAMIN) tablet 1,000 mcg  1,000 mcg Oral Daily Cherene Altes, MD   1,000 mcg at 11/15/13 [phone]  . vitamin C (ASCORBIC ACID) tablet 1,000 mg  1,000 mg Oral Daily Cherene Altes, MD   1,000 mg at 11/15/13 0350    REVIEW OF SYSTEMS:   Not obtained because patient was sleeping  PHYSICAL EXAMINATION: ECOG PERFORMANCE STATUS: 3 - Symptomatic, >50% confined to bed  Filed Vitals:   11/15/13 1658  BP: 109/48  Pulse: 89  Temp: 98.8 F (37.1 C)  Resp: 18   Filed Weights   11/13/13 2057  Weight: 148 lb 12.8 oz (67.495 kg)   Not examined because she was sleeping and I did not wake her up  LABORATORY DATA:  I have reviewed the data as listed Lab Results  Component Value Date   WBC 4.5 11/15/2013   HGB 6.8* 11/15/2013   HCT 20.6* 11/15/2013   MCV 99.5 11/15/2013   PLT 155 11/15/2013   Lab Results  Component Value Date   NA 134* 11/15/2013   K 3.7 11/15/2013   CL 99 11/15/2013   CO2 24 11/15/2013     ASSESSMENT AND PLAN:  Severe anemia due to decreased production and probably myelodysplastic syndrome: Currently she needs blood transfusion for hemoglobin of 6.8. I previously discussed with her that the probability of response to erythropoietin stimulating agent is less than 25% because of the requirement of blood transfusion is so high in her. She has been started on Aranesp 40 mcg weekly once his hospital. She is also on 1000 mcg of vitamin B 12. It is reasonable to continue these measures.  After discharge, she will need lab work at least once a week  until we know how often she needs blood transfusions. I have no further recommendations at this time. I talked at length with the patient's brother.  All questions were answered. The patient knows to call the clinic with any problems, questions or concerns.    Rulon Eisenmenger, MD @T @ 5:18 PM

## 2013-11-15 NOTE — Evaluation (Signed)
Physical Therapy Evaluation Patient Details Name: Erika [name] MRN: [id number] DOB: 20-Dec-1919 Today's Date: 11/15/2013   History of Present Illness  Pt is a 80 yo female presenting with anemia due to suspected MDS and diastolic CHF. Pt also with afib with RVR, acute encephalopathy, lactic acidosis, acute urinary retention. Pt with hx of heart murmur, anemia, migraine, and arthritis.   Clinical Impression  Pt currently requires assistance with AD for bed mobility, transfers and ambulation. Pt would benefit from skilled PT services to address functional limitations noted below and facilitated d/c to next venue of care. POC and d/c plan discussed with pt and family, all agreeable to rehab. Currently recommending SNF.     Follow Up Recommendations SNF;Supervision/Assistance - 24 hour    Equipment Recommendations  Other (comment) (tbd)    Recommendations for Other Services       Precautions / Restrictions Precautions Precautions: Fall Restrictions Weight Bearing Restrictions: No      Mobility  Bed Mobility Overal bed mobility: Needs Assistance Bed Mobility: Rolling;Sidelying to Sit Rolling: Supervision (assist for leads) Sidelying to sit: Min assist       General bed mobility comments: Pt able to roll to L and R without assistance. Pt requires min assist at trunk to power up. Use of rails for rolling and to push up to sitting  Transfers Overall transfer level: Needs assistance Equipment used: Rolling walker (2 wheeled) Transfers: Sit to/from Stand Sit to Stand: Min assist         General transfer comment: Pt requires assist to power up. VC for technique with walker.   Ambulation/Gait Ambulation/Gait assistance: Min assist;Mod assist Ambulation Distance (Feet): 60 Feet Assistive device: Rolling walker (2 wheeled) Gait Pattern/deviations: Step-through pattern;Decreased stride length;Decreased dorsiflexion - right;Decreased dorsiflexion - left;Antalgic;Narrow base of  support;Trunk flexed;Drifts right/left Gait velocity: slow Gait velocity interpretation: Below normal speed for age/gender General Gait Details: Pt demonstrating limited trunk rotation during ambulation. Min assist provide for stability on straight path, increased to mod assist with turning. Pt requires frequency recueing with technique with RW.  Reports of fatigue after ambulation.  Stairs            Wheelchair Mobility    Modified Rankin (Stroke Patients Only)       Balance Overall balance assessment: Needs assistance   Sitting balance-Leahy Scale: Good Sitting balance - Comments: pt able to sit without support. no reports of dizziness   Standing balance support: During functional activity;Bilateral upper extremity supported Standing balance-Leahy Scale: Fair Standing balance comment: pt able to stand with BUE support. no instability noted with static standing, increased instability with ambulation.                              Pertinent Vitals/Pain Pain Assessment: No/denies pain    Home Living Family/patient expects to be discharged to:: Private residence Living Arrangements: Alone (son lives next door) Available Help at Discharge: Family;Available PRN/intermittently Type of Home: House Home Access: Stairs to enter Entrance Stairs-Rails: Psychiatric nurse of Steps: 4 Home Layout: One level Home Equipment: Walker - 2 wheels;Cane - single point;Bedside commode;Shower seat      Prior Function Level of Independence: Needs assistance   Gait / Transfers Assistance Needed: assistance required mobility. Per pt's son, general decline in mobility since about 6 months ago. 11/12/13 pt requiring total assist due to weakness.      Comments: does not drive.      Hand Dominance  Extremity/Trunk Assessment   Upper Extremity Assessment: Generalized weakness           Lower Extremity Assessment: Generalized weakness       Cervical / Trunk Assessment: Kyphotic  Communication   Communication: HOH  Cognition Arousal/Alertness: Awake/alert Behavior During Therapy: WFL for tasks assessed/performed Overall Cognitive Status: Impaired/Different from baseline Area of Impairment: Following commands;Problem solving;Awareness       Following Commands: Follows one step commands consistently   Awareness: Emergent Problem Solving: Requires verbal cues;Requires tactile cues General Comments: Pt able to follow commands, but requires frequent recueing. A Ox4. per family, information provided by pt regarding home setup is accurate, but overall cognition has gradually declined over past few months    General Comments General comments (skin integrity, edema, etc.): POC and d/c plan discussed with pt and family, all agreeable. per pt's son report, over past 6 months, pt has physically and cognitively declined and is worse than she was when she was hospitalized about a month ago.   Pt's family expressing concern over pt's recent decline in functional status and their ability to care for her after d/c.    Exercises        Assessment/Plan    PT Assessment Patient needs continued PT services  PT Diagnosis Difficulty walking;Generalized weakness   PT Problem List Decreased strength;Decreased range of motion;Decreased activity tolerance;Decreased balance;Decreased mobility;Decreased cognition;Decreased knowledge of use of DME;Decreased safety awareness  PT Treatment Interventions DME instruction;Gait training;Functional mobility training;Stair training;Therapeutic activities;Therapeutic exercise;Balance training;Patient/family education;Cognitive remediation   PT Goals (Current goals can be found in the Care Plan section) Acute Rehab PT Goals Patient Stated Goal: to get better PT Goal Formulation: With patient/family Time For Goal Achievement: 11/29/13 Potential to Achieve Goals: Fair    Frequency Min 2X/week    Barriers to discharge        Co-evaluation               End of Session Equipment Utilized During Treatment: Gait belt Activity Tolerance: Patient tolerated treatment well;Patient limited by fatigue Patient left: in chair;with call bell/phone within reach;with family/visitor present Nurse Communication: Mobility status         Time: 1411-1437 PT Time Calculation (min): 26 min   Charges:   PT Evaluation $Initial PT Evaluation Tier I: 1 Procedure PT Treatments $Gait Training: 8-22 mins $Therapeutic Activity: 8-22 mins   PT G Codes:          Jule Whitsel 11/15/2013, 3:10 PM Levonne Hubert, SPT

## 2013-11-15 NOTE — Clinical Social Work Placement (Addendum)
Clinical Social Work Department CLINICAL SOCIAL WORK PLACEMENT NOTE 11/15/2013  Patient:  Erika [name],Erika [name]  Account Number:  [id number] Admit date:  11/13/2013  Clinical Social Worker:  Greta Doom, LCSWA  Date/time:  11/15/2013 04:30 PM  Clinical Social Work is seeking post-discharge placement for this patient at the following level of care:   SKILLED NURSING   (*CSW will update this form in Epic as items are completed)   11/15/2013  Patient/family provided with [facility] Department of Clinical Social Work's list of facilities offering this level of care within the geographic area requested by the patient (or if unable, by the patient's family).  11/15/2013  Patient/family informed of their freedom to choose among providers that offer the needed level of care, that participate in Medicare, Medicaid or managed care program needed by the patient, have an available bed and are willing to accept the patient.  11/15/2013  Patient/family informed of [facility]' ownership interest in [facility], as well as of the fact that they are under no obligation to receive care at this facility.  PASARR submitted to EDS on 11/15/2013 PASARR number received on 11/15/2013  FL2 transmitted to all facilities in geographic area requested by pt/family on  11/15/2013 FL2 transmitted to all facilities within larger geographic area on 11/15/2013  Patient informed that his/her managed care company has contracts with or will negotiate with  certain facilities, including the following:     Patient/family informed of bed offers received: 11/16/13 Randall Hiss R. Adalena Abdulla, MSW, LCSWA) Patient chooses bed at [facility] R. Loudon Krakow, MSW, [place]) Physician recommends and patient chooses bed at    Patient to be transferred to  on  11/19/13 Randall Hiss R. Delrico Minehart, MSW, LCSWA) Patient to be transferred to facility by patient's son Jones Broom. Delontae Lamm, MSW, LCSWA) Patient and family notified of transfer on  11/19/13 Randall Hiss R. Clytee Heinrich, MSW, [place]) Name of family member notified:  Son Evan Osburn 11/19/13 Randall Hiss R. Kobee Medlen, MSW, [place])  The following physician request were entered in Epic:   Additional Comments:  Dysheka Bibbs, MSW, [place]  Jones Broom. Thaily Hackworth, MSW, [place] updated [phone] 11/19/2013 5:17 PM

## 2013-11-15 NOTE — Progress Notes (Signed)
Paged and spoke with Erika [name] of [facility] regarding pt's change in mental status and LOC from previous RNs assessment. Upon assessment pt responsive only to pain. Intermittently pt responds to command to squeeze fingers bilaterally. Per Baltazar Najjar, pt's mental status was similar last night and this change is not acutely different. No orders received. Will continue to monitor and assess.

## 2013-11-15 NOTE — Progress Notes (Signed)
[facility]  CATHALEEN KOROL POE:[id number] DOB: [birth year] DOA: 11/13/2013 PCP: Valaria Good, MD   Brief narrative: 80 year old patient with known anemia suspected to be due to MDS, and diastolic congestive heart failure who was brought by her family to the hospital because of altered mentation.  After presentation was noted to be pale and tachycardic with heart rate in the 150s. She was noted to have diarrhea so C. difficile toxin was obtained. Her TNI was elevated at 2.26. Lactic acid was elevated at 4.23. She was not having any chest pain and her EKG was unremarkable. Her sodium and potassium were low and her BUN was mildly elevated at 23 with creatinine of 0.94 near baseline (0.77).  Chart review since admission reveals patient previously admitted September this year with anemia requiring transfusion (total of 3 units packed red blood cells). An extensive workup was obtained without any source of anemia identified. She was Hemoccult negative. Since that time she has followed up with hematologist Dr. Lindi Adie who surmised the etiology to the patient's anemia was related to myelodysplastic syndrome. Since discharge she has received an additional 2 units of packed red blood cells on 10/17. At her next outpatient hematology visit she was scheduled to begin Aranesp. Since discharge she is also followed up with her primary care physician who stopped her Cozaar because of hypotension on 10/15. Patient's son says that over the past few weeks patient has had very poor appetite with poor intake and has lost about 15 pounds. He also reports patient having issues with progressive short-term memory loss.  HPI/Subjective: More alert today in talking with her son although still seems somewhat sleepy.  Assessment/Plan:    Symptomatic anemia/Myelodysplastic syndrome Hemoglobin at presentation around 10 but has decreased to 8 after hydration which appears to be  patient's current baseline posttransfusion-give first dose of Aranesp while inpatient-on 10/29 hemoglobin down to 6.8 after hydration therefore we'll transfuse 2 units packed red blood cells    Atrial fibrillation with RVR Related to dehydration as well as general physiologic stress-currently has resolved/reverted back to sinus rhythm-given advanced age and underlying myelodysplastic syndrome not an appropriate candidate for long-term anticoagulation    Acute encephalopathy Resolved-may have underlying undiagnosed mild dementia    Elevated troponin c/w demand ischemia further worsened by RVR which also decreased perfusion-trend is down    Lactic acidosis /Dehydration with hyponatremia Resolved -Secondary to poor intake prior to admission-presented with hypotension and elevated lactic acid as well as hyponatremia-noted patient's Cozaar was discontinued on 10/15 by primary care physician -IV fluids discontinued 10/29   Acute urinary retention Onset early this morning requiring placement of Foley catheter (after several I/O catheterizations) -we'll attempt to discontinue Foley catheter on 10/30 at 8 AM-if urinary retention recurs we'll need to initiate Urecholine    Abnormal urinalysis Urine culture negative-discontinue antibiotics    Malnutrition of moderate degree/FTT (failure to thrive) in adult PT/OT evaluation this admission once volume restored and afib remains controlled/resovled -continue Ensure supplements    Elevated TSH Sick euthyroid syndrome-free T4 normal   Diarrhea Patient with issues of constipation prior to admission-was given multiple laxatives prior to admission and subsequently had diarrhea afterwards-C. difficile negative   DVT prophylaxis: SCDs Code Status: DO NOT RESUSCITATE Family Communication: Extensive conversation with son at bedside Disposition Plan/Expected LOS: Transfer to  telemetry   Consultants: Hematology  Procedures: None  Antibiotics: Levaquin 10/27>10/29  Objective: Blood pressure 99/54, pulse 93, temperature 99.5 F (  37.5 C), temperature source Oral, resp. rate 25, height 5\' 3"  (1.6 m), weight 148 lb 12.8 oz (67.495 kg), SpO2 95.00%.  Intake/Output Summary (Last 24 hours) at 11/15/13 1328 Last data filed at 11/15/13 1100  Gross per 24 hour  Intake 3531.75 ml  Output   3090 ml  Net 441.75 ml    Exam: Gen: No acute respiratory distress-alert and without complaints Chest: Clear to auscultation bilaterally, 2 L Cardiac: Regular rate and rhythm, S1-S2, no rubs murmurs or gallops Abdomen: Soft nontender nondistended without obvious hepatosplenomegaly, no ascites Extremities: no signif c/c/edema B LE  Scheduled Meds:  Scheduled Meds: . sodium chloride   Intravenous Once  . B-complex with vitamin C  1 tablet Oral Daily  . beta carotene w/minerals  1 tablet Oral Daily  . cholecalciferol  1,000 Units Oral Daily  . darbepoetin (ARANESP) injection - NON-DIALYSIS  40 mcg Subcutaneous Weekly  . docusate sodium  100 mg Oral BID  . feeding supplement (ENSURE COMPLETE)  237 mL Oral TID BM  . megestrol  800 mg Oral Daily  . sodium chloride  3 mL Intravenous Q12H  . vitamin B-12  1,000 mcg Oral Daily  . vitamin C  1,000 mg Oral Daily   Data Reviewed: Basic Metabolic Panel:  Recent Labs Lab 11/13/13 1600 11/14/13 0253 11/15/13 0422  NA 131* 129* 134*  K 2.9* 3.3* 3.7  CL 87* 90* 99  CO2 19 23 24   GLUCOSE 62* 74 104*  BUN 23 28* 21  CREATININE 0.94 1.23* 0.83  CALCIUM 9.4 8.0* 8.2*   Liver Function Tests:  Recent Labs Lab 11/15/13 0422  AST 20  ALT 17  ALKPHOS 74  BILITOT 0.4  PROT 5.7*  ALBUMIN 2.3*    CBC:  Recent Labs Lab 11/13/13 1559 11/14/13 0253 11/15/13 0422 11/15/13 1130  WBC 8.9 10.9* 6.3 4.5  NEUTROABS 2.5  --   --   --   HGB 10.6* 8.5* 7.8* 6.8*  HCT 30.6* 24.9* 23.2* 20.6*  MCV 96.5 98.4 97.9 99.5   PLT 238 179 155 155   Cardiac Enzymes:  Recent Labs Lab 11/13/13 2246 11/14/13 0253 11/14/13 0946  TROPONINI 2.26* 1.05* 0.70*   BNP (last 3 results)  Recent Labs  10/09/13 1251  PROBNP 1194.0*    Recent Results (from the past 240 hour(s))  CULTURE, BLOOD (ROUTINE X 2)     Status: None   Collection Time    11/13/13  4:00 PM      Result Value Ref Range Status   Specimen Description BLOOD ARM LEFT   Final   Special Requests BOTTLES DRAWN AEROBIC AND ANAEROBIC 10CC   Final   Culture  Setup Time     Final   Value: 11/13/2013 22:38     Performed at [facility]   Culture     Final   Value:        BLOOD CULTURE RECEIVED NO GROWTH TO DATE CULTURE WILL BE HELD FOR 5 DAYS BEFORE ISSUING A FINAL NEGATIVE REPORT     Performed at [facility]   Report Status PENDING   Incomplete  CULTURE, BLOOD (ROUTINE X 2)     Status: None   Collection Time    11/13/13  4:08 PM      Result Value Ref Range Status   Specimen Description BLOOD ARM RIGHT   Final   Special Requests BOTTLES DRAWN AEROBIC AND ANAEROBIC 5CC   Final   Culture  Setup Time  Final   Value: 11/13/2013 22:37     Performed at [facility]   Culture     Final   Value:        BLOOD CULTURE RECEIVED NO GROWTH TO DATE CULTURE WILL BE HELD FOR 5 DAYS BEFORE ISSUING A FINAL NEGATIVE REPORT     Performed at [facility]   Report Status PENDING   Incomplete  URINE CULTURE     Status: None   Collection Time    11/13/13  5:33 PM      Result Value Ref Range Status   Specimen Description URINE, CATHETERIZED   Final   Special Requests NONE   Final   Culture  Setup Time     Final   Value: 11/14/2013 00:19     Performed at [place]     Final   Value: NO GROWTH     Performed at [facility]   Culture     Final   Value: NO GROWTH     Performed at [facility]   Report Status 11/15/2013 FINAL   Final  CLOSTRIDIUM DIFFICILE BY PCR     Status: None    Collection Time    11/13/13  6:17 PM      Result Value Ref Range Status   C difficile by pcr NEGATIVE  NEGATIVE Final  MRSA PCR SCREENING     Status: None   Collection Time    11/13/13  9:16 PM      Result Value Ref Range Status   MRSA by PCR NEGATIVE  NEGATIVE Final   Comment:            The GeneXpert MRSA Assay (FDA     approved for NASAL specimens     only), is one component of a     comprehensive MRSA colonization     surveillance program. It is not     intended to diagnose MRSA     infection nor to guide or     monitor treatment for     MRSA infections.     Studies:  Recent x-ray studies have been reviewed in detail by the Attending Physician  Time spent :  [place], ANP [facility] Office  [phone] Pager [phone]  **If unable to reach the above provider after paging please contact the [place] @ [phone]  On-Call/Text Page:      [email or web address]      password [email or web address]  If 7PM-7AM, please contact night-coverage [email or web address] Password [email or web address] 11/15/2013, 1:28 PM   LOS: 2 days   Attending Patient was seen, examined,treatment plan was discussed with the  Advance Practice Provider.  I have directly reviewed the clinical findings, lab, imaging studies and management of this patient in detail. I have made the necessary changes to the above noted documentation, and agree with the documentation, as recorded by the Advance Practice Provider.   Now in NSR, Hb decreasing-has MDS-transfuse 2 units. Stable for transfer out of SDU  Nena Alexander MD [facility].

## 2013-11-15 NOTE — Clinical Social Work Psychosocial (Signed)
Clinical Social Work Department BRIEF PSYCHOSOCIAL ASSESSMENT 11/15/2013  Patient:  Barbour,Seraiah C     Account Number:  [id number]     Admit date:  11/13/2013  Clinical Social Worker:  Marciano Sequin  Date/Time:  11/15/2013 04:00 PM  Referred by:  RN  Date Referred:  11/15/2013 Referred for  SNF Placement   Other Referral:   Interview type:  Patient Other interview type:    PSYCHOSOCIAL DATA Living Status:  ALONE Admitted from facility:   Level of care:   Primary support name:  Kyung Rudd and Mickel Baas Primary support relationship to patient:  CHILD, ADULT Degree of support available:   Strong Support System    CURRENT CONCERNS  Other Concerns:    SOCIAL WORK ASSESSMENT / PLAN CSW met the pt and her sons Read Drivers and Charles at bedside. CSW introduced self and purpose of the visit. CSW discussed the clinical team's recommendations for rehab. Pt acknowledged needing additional help given her current medical conditions. CSW, the pt, and family reviewed the SNF list. The pt's son expressed interested in [facility] CSW, the pt, and family discussed the pt's insurance as it relates to SNF placement. CSW explained the SNF process to the pt and family. CSW answered all questions in which pt and family inquired about. CSW provided the pt and family with contact information for further questions. CSW will continue to follow this pt and assist with discharge as needed.   Assessment/plan status:  Psychosocial Support/Ongoing Assessment of Needs Other assessment/ plan:   Information/referral to community resources:    PATIENT'S/FAMILY'S RESPONSE TO PLAN OF CARE: The pt presented with a bright affect and upbeat mood. The pt expressed gratitude for assisting her and her family through the SNF process.    [place], MSW, [place]

## 2013-11-15 NOTE — Progress Notes (Signed)
Report called pt transferring to 2W16 via w/c with belongings and family.

## 2013-11-16 LAB — CBC WITH DIFFERENTIAL/PLATELET
BASOS ABS: 0 10*3/uL (ref 0.0–0.1)
Basophils Relative: 0 % (ref 0–1)
EOS PCT: 0 % (ref 0–5)
Eosinophils Absolute: 0 10*3/uL (ref 0.0–0.7)
HCT: 27.9 % — ABNORMAL LOW (ref 36.0–46.0)
Hemoglobin: 9.4 g/dL — ABNORMAL LOW (ref 12.0–15.0)
LYMPHS ABS: 1.6 10*3/uL (ref 0.7–4.0)
LYMPHS PCT: 51 % — AB (ref 12–46)
MCH: 31.4 pg (ref 26.0–34.0)
MCHC: 33.7 g/dL (ref 30.0–36.0)
MCV: 93.3 fL (ref 78.0–100.0)
MONOS PCT: 24 % — AB (ref 3–12)
Monocytes Absolute: 0.8 10*3/uL (ref 0.1–1.0)
NEUTROS PCT: 25 % — AB (ref 43–77)
Neutro Abs: 0.8 10*3/uL — ABNORMAL LOW (ref 1.7–7.7)
PLATELETS: 137 10*3/uL — AB (ref 150–400)
RBC: 2.99 MIL/uL — AB (ref 3.87–5.11)
RDW: 20.5 % — ABNORMAL HIGH (ref 11.5–15.5)
WBC: 3.2 10*3/uL — AB (ref 4.0–10.5)

## 2013-11-16 LAB — VITAMIN B12: Vitamin B-12: 1541 pg/mL — ABNORMAL HIGH (ref 211–911)

## 2013-11-16 LAB — FOLATE: Folate: >20 ng/mL

## 2013-11-16 LAB — FERRITIN: FERRITIN: 1157 ng/mL — AB (ref 10–291)

## 2013-11-16 MED ORDER — HYDROCODONE-ACETAMINOPHEN 5-325 MG PO TABS: 1.0000 | ORAL_TABLET | Freq: Four times a day (QID) | ORAL | Status: DC | PRN

## 2013-11-16 NOTE — Evaluation (Signed)
Occupational Therapy Evaluation Patient Details Name: Erika [name] MRN: [id number] DOB: 25-Mar-1919 Today's Date: 11/16/2013    History of Present Illness Pt is a 80 yo female presenting with anemia due to suspected MDS and diastolic CHF. Pt also with afib with RVR, acute encephalopathy, lactic acidosis, acute urinary retention. Pt with hx of heart murmur, anemia, migraien, and arthritis.    Clinical Impression   Pt admitted with above. She demonstrates the below listed deficits and will benefit from continued OT to maximize safety and independence with BADLs.  Pt presents to OT with generalized weakness.  Currently, she requires min guard - min A with BADLs.  Recommend SNF level rehab at discharge.       Follow Up Recommendations  SNF    Equipment Recommendations  None recommended by OT    Recommendations for Other Services       Precautions / Restrictions Precautions Precautions: Fall      Mobility Bed Mobility                  Transfers Overall transfer level: Needs assistance Equipment used: Rolling walker (2 wheeled) Transfers: Sit to/from [facility] Sit to Stand: Min guard Stand pivot transfers: Min guard       General transfer comment: verbal cues for hand placement     Balance Overall balance assessment: Needs assistance Sitting-balance support: Feet supported Sitting balance-Leahy Scale: Good     Standing balance support: During functional activity Standing balance-Leahy Scale: Fair                              ADL Overall ADL's : Needs assistance/impaired Eating/Feeding: Independent   Grooming: Wash/dry hands;Wash/dry face;Oral care;Min guard;Standing   Upper Body Bathing: Set up;Sitting   Lower Body Bathing: Min guard;Sit to/from stand   Upper Body Dressing : Set up;Sitting   Lower Body Dressing: Min guard;Sit to/from stand   Toilet Transfer: Min guard;Ambulation;RW   Toileting- Water quality scientist  and Hygiene: Min guard;Sit to/from stand       Functional mobility during ADLs: Min guard;Rolling walker General ADL Comments: Pt fatigues with activity.  02 sats remained 92-94% throughout session      Vision                     Perception     Praxis      Pertinent Vitals/Pain Pain Assessment: No/denies pain     Hand Dominance Right   Extremity/Trunk Assessment Upper Extremity Assessment Upper Extremity Assessment: Generalized weakness   Lower Extremity Assessment Lower Extremity Assessment: Defer to PT evaluation   Cervical / Trunk Assessment Cervical / Trunk Assessment: Kyphotic   Communication Communication Communication: HOH   Cognition Arousal/Alertness: Awake/alert Behavior During Therapy: WFL for tasks assessed/performed   Area of Impairment: Problem solving;Safety/judgement       Following Commands: Follows one step commands consistently     Problem Solving: Requires verbal cues     General Comments       Exercises       Shoulder Instructions      Home Living Family/patient expects to be discharged to:: Skilled nursing facility                                        Prior Functioning/Environment Level of Independence: Needs assistance  Gait / Transfers Assistance  Needed: assistance required mobility. Per pt's son, general decline in mobility since about 6 months ago. 11/12/13 pt requiring total assist due to weakness.  ADL's / Homemaking Assistance Needed: Pt reports she was performing BADLs and IADLs mod I   Comments: does not drive. assistance for mowing grass    OT Diagnosis: Generalized weakness;Cognitive deficits   OT Problem List: Decreased strength;Impaired balance (sitting and/or standing);Decreased activity tolerance;Decreased safety awareness;Decreased cognition;Decreased knowledge of use of DME or AE   OT Treatment/Interventions: Self-care/ADL training;DME and/or AE instruction;Therapeutic  activities;Patient/family education;Balance training    OT Goals(Current goals can be found in the care plan section) Acute Rehab OT Goals Patient Stated Goal: to go home  OT Goal Formulation: With patient Time For Goal Achievement: 11/30/13 Potential to Achieve Goals: Good ADL Goals Pt Will Perform Grooming: with modified independence;standing Pt Will Perform Upper Body Bathing: with modified independence;sitting Pt Will Perform Lower Body Bathing: with modified independence;sit to/from stand Pt Will Perform Upper Body Dressing: with modified independence;sitting Pt Will Perform Lower Body Dressing: with modified independence;sit to/from stand Pt Will Transfer to Toilet: with modified independence;ambulating;regular height toilet;grab bars Pt Will Perform Toileting - Clothing Manipulation and hygiene: with modified independence;sit to/from stand  OT Frequency: Min 2X/week   Barriers to D/C:            Co-evaluation              End of Session Equipment Utilized During Treatment: Surveyor, mining Communication: Mobility status  Activity Tolerance: Patient tolerated treatment well Patient left: in chair;with call bell/phone within reach;with family/visitor present   Time: 1336-1400 OT Time Calculation (min): 24 min Charges:    G-Codes:    Charlesia Canaday M 11-21-2013, 2:14 PM

## 2013-11-16 NOTE — Progress Notes (Signed)
Chart reviewed. Discussed with [name].    Progress Note  Erika [name] MCN:[id number] DOB: October 14, 1919 DOA: 11/13/2013 PCP: Valaria Good, MD   Brief narrative: 80 year old patient with known anemia suspected to be due to MDS, and diastolic congestive heart failure who was brought by her family to the hospital because of altered mentation.  After presentation was noted to be pale and tachycardic with heart rate in the 150s. She was noted to have diarrhea so C. difficile toxin was obtained. Her TNI was elevated at 2.26. Lactic acid was elevated at 4.23. She was not having any chest pain and her EKG was unremarkable. Her sodium and potassium were low and her BUN was mildly elevated at 23 with creatinine of 0.94 near baseline (0.77).  Chart review since admission reveals patient previously admitted September this year with anemia requiring transfusion (total of 3 units packed red blood cells). An extensive workup was obtained without any source of anemia identified. She was Hemoccult negative. Since that time she has followed up with hematologist Dr. Lindi Adie who diagnosed MDS clinically, forgoing BM Bx in this elderly patient.  Assessment/Plan:    Symptomatic anemia/Myelodysplastic syndrome Hemoglobin at presentation around 10 (hemoconcentrated)  Dropped to 6.8 10/29. 9.4 today after 2 units prbc. continue weekly aranesp. Appreciate Dr. Geralyn Flash consult. Transfer to SNF soon if remains stable. Needs weekly h/h and aranesp at [facility]    Atrial fibrillation with RVR Related to dehydration, physiologic stress-converted to NSR. Not an anticoagulation candidate due to age, deconditioning, anemia    Acute encephalopathy Resolved-may have underlying undiagnosed mild dementia    Elevated troponin c/w demand ischemia further worsened by RVR which also decreased perfusion-trend is down. No further workup    Lactic acidosis /Dehydration with hyponatremia Resolved -Secondary to poor intake prior to  admission-presented with hypotension and elevated lactic acid as well as hyponatremia-noted patient's Cozaar was discontinued on 10/15 by primary care physician -IV fluids discontinued 10/29   Acute urinary retention Voiding trial today    Abnormal urinalysis Urine culture negative-discontinue antibiotics    Malnutrition of moderate degree/FTT (failure to thrive) in adult Eating better    Elevated TSH Sick euthyroid syndrome-free T4 normal. Repeat as outpatient   Diarrhea c diff neg. None further. Had been on laxatives  Deconditioning: to SNF soon if h/h stable   DVT prophylaxis: SCDs Code Status: DO NOT RESUSCITATE Family Communication: multiple at bedside Disposition Plan/Expected LOS: SNF soon if h/h stable   Consultants: Hematology  Procedures: None  Antibiotics: Levaquin 10/27>10/29  Objective: Blood pressure 156/87, pulse 91, temperature 98.9 F (37.2 C), temperature source Oral, resp. rate 18, height 5\' 3"  (1.6 m), weight 69.854 kg (154 lb), SpO2 94.00%.  Intake/Output Summary (Last 24 hours) at 11/16/13 1309 Last data filed at 11/16/13 0408  Gross per 24 hour  Intake    335 ml  Output   1125 ml  Net   -790 ml    Exam: Gen: a, o and appropriate. On commode Chest: Clear to auscultation bilaterally without WRR Cardiac: Regular rate and rhythm, S1-S2, no rubs murmurs or gallops Abdomen: Soft nontender nondistended without obvious hepatosplenomegaly, no ascites Extremities: no signif c/c/edema B LE  Scheduled Meds:  Scheduled Meds: . sodium chloride   Intravenous Once  . B-complex with vitamin C  1 tablet Oral Daily  . beta carotene w/minerals  1 tablet Oral Daily  . cholecalciferol  1,000 Units Oral Daily  . darbepoetin (ARANESP) injection - NON-DIALYSIS  40 mcg Subcutaneous Weekly  .  docusate sodium  100 mg Oral BID  . feeding supplement (ENSURE COMPLETE)  237 mL Oral TID BM  . megestrol  800 mg Oral Daily  . sodium chloride  3 mL Intravenous Q12H    . vitamin B-12  1,000 mcg Oral Daily  . vitamin C  1,000 mg Oral Daily   Data Reviewed: Basic Metabolic Panel:  Recent Labs Lab 11/13/13 1600 11/14/13 0253 11/15/13 0422  NA 131* 129* 134*  K 2.9* 3.3* 3.7  CL 87* 90* 99  CO2 19 23 24   GLUCOSE 62* 74 104*  BUN 23 28* 21  CREATININE 0.94 1.23* 0.83  CALCIUM 9.4 8.0* 8.2*   Liver Function Tests:  Recent Labs Lab 11/15/13 0422  AST 20  ALT 17  ALKPHOS 74  BILITOT 0.4  PROT 5.7*  ALBUMIN 2.3*    CBC:  Recent Labs Lab 11/13/13 1559 11/14/13 0253 11/15/13 0422 11/15/13 1130 11/16/13 0802  WBC 8.9 10.9* 6.3 4.5 3.2*  NEUTROABS 2.5  --   --   --  0.8*  HGB 10.6* 8.5* 7.8* 6.8* 9.4*  HCT 30.6* 24.9* 23.2* 20.6* 27.9*  MCV 96.5 98.4 97.9 99.5 93.3  PLT 238 179 155 155 137*   Cardiac Enzymes:  Recent Labs Lab 11/13/13 2246 11/14/13 0253 11/14/13 0946  TROPONINI 2.26* 1.05* 0.70*   BNP (last 3 results)  Recent Labs  10/09/13 1251  PROBNP 1194.0*    Recent Results (from the past 240 hour(s))  CULTURE, BLOOD (ROUTINE X 2)     Status: None   Collection Time    11/13/13  4:00 PM      Result Value Ref Range Status   Specimen Description BLOOD ARM LEFT   Final   Special Requests BOTTLES DRAWN AEROBIC AND ANAEROBIC 10CC   Final   Culture  Setup Time     Final   Value: 11/13/2013 22:38     Performed at [facility]   Culture     Final   Value:        BLOOD CULTURE RECEIVED NO GROWTH TO DATE CULTURE WILL BE HELD FOR 5 DAYS BEFORE ISSUING A FINAL NEGATIVE REPORT     Performed at [facility]   Report Status PENDING   Incomplete  CULTURE, BLOOD (ROUTINE X 2)     Status: None   Collection Time    11/13/13  4:08 PM      Result Value Ref Range Status   Specimen Description BLOOD ARM RIGHT   Final   Special Requests BOTTLES DRAWN AEROBIC AND ANAEROBIC 5CC   Final   Culture  Setup Time     Final   Value: 11/13/2013 22:37     Performed at [facility]   Culture     Final   Value:         BLOOD CULTURE RECEIVED NO GROWTH TO DATE CULTURE WILL BE HELD FOR 5 DAYS BEFORE ISSUING A FINAL NEGATIVE REPORT     Performed at [facility]   Report Status PENDING   Incomplete  URINE CULTURE     Status: None   Collection Time    11/13/13  5:33 PM      Result Value Ref Range Status   Specimen Description URINE, CATHETERIZED   Final   Special Requests NONE   Final   Culture  Setup Time     Final   Value: 11/14/2013 00:19     Performed at [facility]  Count     Final   Value: NO GROWTH     Performed at [facility]   Culture     Final   Value: NO GROWTH     Performed at [facility]   Report Status 11/15/2013 FINAL   Final  CLOSTRIDIUM DIFFICILE BY PCR     Status: None   Collection Time    11/13/13  6:17 PM      Result Value Ref Range Status   C difficile by pcr NEGATIVE  NEGATIVE Final  MRSA PCR SCREENING     Status: None   Collection Time    11/13/13  9:16 PM      Result Value Ref Range Status   MRSA by PCR NEGATIVE  NEGATIVE Final   Comment:            The GeneXpert MRSA Assay (FDA     approved for NASAL specimens     only), is one component of a     comprehensive MRSA colonization     surveillance program. It is not     intended to diagnose MRSA     infection nor to guide or     monitor treatment for     MRSA infections.     Studies:  Recent x-ray studies have been reviewed in detail by the Attending Physician  Time spent :  [place], ANP [facility]  [phone] Pager [phone]  **If unable to reach the above provider after paging please contact the [place] @ [phone]  On-Call/Text Page:      [email or web address]      password [email or web address]  If 7PM-7AM, please contact night-coverage [email or web address] Password [email or web address] 11/16/2013, 1:09 PM   LOS: 3 days

## 2013-11-16 NOTE — Clinical Social Work Note (Signed)
Spoke to patient and her son about bed offers that were received for placement to a SNF.  Patient and son would like [facility] as a first choice and then [facility] as a second choice.  Son and patient expressed interest in having home health once patient discharges from a facility.  Informed patient that the SNF that she goes to will help set up home health once patient is ready to discharge from SNF.  Jones Broom. [place], MSW, [place] 11/16/2013 5:08 PM

## 2013-11-16 NOTE — Care Management Note (Signed)
    Page 1 of 1   11/20/2013     2:25:04 PM CARE MANAGEMENT NOTE 11/20/2013  Patient:  Erika [name]   Account Number:  [id number]  Date Initiated:  11/14/2013  Documentation initiated by:  Marvetta Gibbons  Subjective/Objective Assessment:   Pt admitted with AMS, afib     Action/Plan:   PTA pt lived at home, family nearby, PT/OT evals pending- NCM to follow for recommendations   Anticipated DC Date:  11/19/2013   Anticipated DC Plan:  [place]  CM consult      Choice offered to / List presented to:             Status of service:  In process, will continue to follow Medicare Important Message given?  YES (If response is "NO", the following Medicare IM given date fields will be blank) Date Medicare IM given:  11/16/2013 Medicare IM given by:  Iza Preston Date Additional Medicare IM given:  11/19/2013 Additional Medicare IM given by:  Zamaya Rapaport  Discharge Disposition:  [place]  Per UR Regulation:  Reviewed for med. necessity/level of care/duration of stay  If discussed at [place] of Stay Meetings, dates discussed:   11/20/2013    Comments:  11/19/13 [name], RN, BSN [phone] pt discharged to SNF today, per CSW arrangements.  11/16/13 [name], RN, BSN [phone] PT/OT recommending SNF at dc, and CSW consulted to facilitate dc to SNF when medically stable for dc.  Will follow progress.

## 2013-11-17 LAB — TYPE AND SCREEN
ABO/RH(D): A POS
Antibody Screen: NEGATIVE
Unit division: 0
Unit division: 0

## 2013-11-17 LAB — BASIC METABOLIC PANEL
ANION GAP: 13 (ref 5–15)
BUN: 15 mg/dL (ref 6–23)
CO2: 23 meq/L (ref 19–32)
Calcium: 8.7 mg/dL (ref 8.4–10.5)
Chloride: 100 mEq/L (ref 96–112)
Creatinine, Ser: 0.7 mg/dL (ref 0.50–1.10)
GFR calc Af Amer: 85 mL/min — ABNORMAL LOW (ref >90)
GFR calc non Af Amer: 73 mL/min — ABNORMAL LOW (ref >90)
Glucose, Bld: 90 mg/dL (ref 70–99)
Potassium: 4.6 mEq/L (ref 3.7–5.3)
SODIUM: 136 meq/L — AB (ref 137–147)

## 2013-11-17 LAB — CBC
HCT: 27.4 % — ABNORMAL LOW (ref 36.0–46.0)
Hemoglobin: 9.2 g/dL — ABNORMAL LOW (ref 12.0–15.0)
MCH: 31.6 pg (ref 26.0–34.0)
MCHC: 33.6 g/dL (ref 30.0–36.0)
MCV: 94.2 fL (ref 78.0–100.0)
Platelets: 148 10*3/uL — ABNORMAL LOW (ref 150–400)
RBC: 2.91 MIL/uL — AB (ref 3.87–5.11)
RDW: 20.7 % — ABNORMAL HIGH (ref 11.5–15.5)
WBC: 3.6 10*3/uL — AB (ref 4.0–10.5)

## 2013-11-17 NOTE — Progress Notes (Signed)
Progress Note  Erika [name] HDQ:[id number] DOB: [birth year] DOA: 11/13/2013 PCP: Valaria Good, MD   Brief narrative: 80 year old patient with known anemia suspected to be due to MDS, and diastolic congestive heart failure who was brought by her family to the hospital because of altered mentation.  After presentation was noted to be pale and tachycardic with heart rate in the 150s. She was noted to have diarrhea so C. difficile toxin was obtained. Her TNI was elevated at 2.26. Lactic acid was elevated at 4.23. She was not having any chest pain and her EKG was unremarkable. Her sodium and potassium were low and her BUN was mildly elevated at 23 with creatinine of 0.94 near baseline (0.77).  Chart review since admission reveals patient previously admitted September this year with anemia requiring transfusion (total of 3 units packed red blood cells). An extensive workup was obtained without any source of anemia identified. She was Hemoccult negative. Since that time she has followed up with hematologist Dr. Lindi Adie who diagnosed MDS clinically, forgoing BM Bx in this elderly patient.  Assessment/Plan:    Symptomatic anemia/Myelodysplastic syndrome Hemoglobin at presentation around 10 (hemoconcentrated)  Dropped to 6.8 10/29. 9.4 today after 2 units prbc. continue weekly aranesp. Appreciate Dr. Geralyn Flash consult. Transfer to SNF soon if remains stable. [facility]eds [facility] h[facility]h and [facility]p [facility]    Atrial fibrillation with RVR Related to dehydration, physiologic stress-converted to NSR. Not an anticoagulation candidate due to age, deconditioning, anemia    Acute encephalopathy Resolved-may have underlying undiagnosed mild dementia    Elevated troponin c/w demand ischemia further worsened by RVR which also decreased perfusion-trend is down. No further workup    Lactic acidosis /Dehydration with hyponatremia Resolved -Secondary to poor intake prior to admission-presented with hypotension and  elevated lactic acid as well as hyponatremia-noted patient's Cozaar was discontinued on 10/15 by primary care physician -IV fluids discontinued 10/29   Acute urinary retention Voiding trial today    Abnormal urinalysis Urine culture negative-discontinue antibiotics    Malnutrition of moderate degree/FTT (failure to thrive) in adult Eating better    Elevated TSH Sick euthyroid syndrome-free T4 normal. Repeat as outpatient   Diarrhea c diff neg. None further. Had been on laxatives  Deconditioning: to SNF soon if h/h stable   DVT prophylaxis: SCDs Code Status: DO NOT RESUSCITATE Family Communication: son at bedside Disposition Plan/Expected LOS: SNF when bed   Consultants: Hematology  Procedures: None  Antibiotics: Levaquin 10/27>10/29  Objective: Blood pressure 136/76, pulse 81, temperature 98.7 F (37.1 C), temperature source Oral, resp. rate 19, height 5\' 3"  (1.6 m), weight 69.854 kg (154 lb), SpO2 96.00%.  Intake/Output Summary (Last 24 hours) at 11/17/13 1194 Last data filed at 11/17/13 0800  Gross per 24 hour  Intake    480 ml  Output   3006 ml  Net  -2526 ml    Exam: Gen: a, o and appropriate. On commode Chest: Clear to auscultation bilaterally without WRR Cardiac: Regular rate and rhythm, S1-S2, no rubs murmurs or gallops Abdomen: Soft nontender nondistended without obvious hepatosplenomegaly, no ascites Extremities: no signif c/c/edema B LE  Scheduled Meds:  Scheduled Meds: . B-complex with vitamin C  1 tablet Oral Daily  . beta carotene w/minerals  1 tablet Oral Daily  . cholecalciferol  1,000 Units Oral Daily  . darbepoetin (ARANESP) injection - NON-DIALYSIS  40 mcg Subcutaneous Weekly  . docusate sodium  100 mg Oral BID  . feeding supplement (ENSURE COMPLETE)  237 mL  Oral TID BM  . megestrol  800 mg Oral Daily  . sodium chloride  3 mL Intravenous Q12H  . vitamin B-12  1,000 mcg Oral Daily  . vitamin C  1,000 mg Oral Daily   Data  Reviewed: Basic Metabolic Panel:  Recent Labs Lab 11/13/13 1600 11/14/13 0253 11/15/13 0422 11/17/13 0524  NA 131* 129* 134* 136*  K 2.9* 3.3* 3.7 4.6  CL 87* 90* 99 100  CO2 19 23 24 23   GLUCOSE 62* 74 104* 90  BUN 23 28* 21 15  CREATININE 0.94 1.23* 0.83 0.70  CALCIUM 9.4 8.0* 8.2* 8.7   Liver Function Tests:  Recent Labs Lab 11/15/13 0422  AST 20  ALT 17  ALKPHOS 74  BILITOT 0.4  PROT 5.7*  ALBUMIN 2.3*    CBC:  Recent Labs Lab 11/13/13 1559 11/14/13 0253 11/15/13 0422 11/15/13 1130 11/16/13 0802 11/17/13 0524  WBC 8.9 10.9* 6.3 4.5 3.2* 3.6*  NEUTROABS 2.5  --   --   --  0.8*  --   HGB 10.6* 8.5* 7.8* 6.8* 9.4* 9.2*  HCT 30.6* 24.9* 23.2* 20.6* 27.9* 27.4*  MCV 96.5 98.4 97.9 99.5 93.3 94.2  PLT 238 179 155 155 137* 148*   Cardiac Enzymes:  Recent Labs Lab 11/13/13 2246 11/14/13 0253 11/14/13 0946  TROPONINI 2.26* 1.05* 0.70*   BNP (last 3 results)  Recent Labs  10/09/13 1251  PROBNP 1194.0*    Recent Results (from the past 240 hour(s))  CULTURE, BLOOD (ROUTINE X 2)     Status: None   Collection Time    11/13/13  4:00 PM      Result Value Ref Range Status   Specimen Description BLOOD ARM LEFT   Final   Special Requests BOTTLES DRAWN AEROBIC AND ANAEROBIC 10CC   Final   Culture  Setup Time     Final   Value: 11/13/2013 22:38     Performed at [facility]   Culture     Final   Value:        BLOOD CULTURE RECEIVED NO GROWTH TO DATE CULTURE WILL BE HELD FOR 5 DAYS BEFORE ISSUING A FINAL NEGATIVE REPORT     Performed at [facility]   Report Status PENDING   Incomplete  CULTURE, BLOOD (ROUTINE X 2)     Status: None   Collection Time    11/13/13  4:08 PM      Result Value Ref Range Status   Specimen Description BLOOD ARM RIGHT   Final   Special Requests BOTTLES DRAWN AEROBIC AND ANAEROBIC 5CC   Final   Culture  Setup Time     Final   Value: 11/13/2013 22:37     Performed at [facility]   Culture     Final    Value:        BLOOD CULTURE RECEIVED NO GROWTH TO DATE CULTURE WILL BE HELD FOR 5 DAYS BEFORE ISSUING A FINAL NEGATIVE REPORT     Performed at [facility]   Report Status PENDING   Incomplete  URINE CULTURE     Status: None   Collection Time    11/13/13  5:33 PM      Result Value Ref Range Status   Specimen Description URINE, CATHETERIZED   Final   Special Requests NONE   Final   Culture  Setup Time     Final   Value: 11/14/2013 00:19     Performed at [facility]  Colony Count     Final   Value: NO GROWTH     Performed at [facility]   Culture     Final   Value: NO GROWTH     Performed at [facility]   Report Status 11/15/2013 FINAL   Final  CLOSTRIDIUM DIFFICILE BY PCR     Status: None   Collection Time    11/13/13  6:17 PM      Result Value Ref Range Status   C difficile by pcr NEGATIVE  NEGATIVE Final  MRSA PCR SCREENING     Status: None   Collection Time    11/13/13  9:16 PM      Result Value Ref Range Status   MRSA by PCR NEGATIVE  NEGATIVE Final   Comment:            The GeneXpert MRSA Assay (FDA     approved for NASAL specimens     only), is one component of a     comprehensive MRSA colonization     surveillance program. It is not     intended to diagnose MRSA     infection nor to guide or     monitor treatment for     MRSA infections.      Time spent :  35 mins  [place] [facility] [phone]     If 7PM-7AM, please contact night-coverage [email or web address] Password [email or web address] 11/17/2013, 9:27 AM   LOS: 4 days

## 2013-11-18 DIAGNOSIS — R946 Abnormal results of thyroid function studies: Secondary | ICD-10-CM

## 2013-11-18 LAB — BASIC METABOLIC PANEL
ANION GAP: 12 (ref 5–15)
BUN: 16 mg/dL (ref 6–23)
CALCIUM: 8.9 mg/dL (ref 8.4–10.5)
CO2: 24 meq/L (ref 19–32)
Chloride: 100 mEq/L (ref 96–112)
Creatinine, Ser: 0.63 mg/dL (ref 0.50–1.10)
GFR calc Af Amer: 88 mL/min — ABNORMAL LOW (ref >90)
GFR calc non Af Amer: 76 mL/min — ABNORMAL LOW (ref >90)
Glucose, Bld: 91 mg/dL (ref 70–99)
Potassium: 4.3 mEq/L (ref 3.7–5.3)
SODIUM: 136 meq/L — AB (ref 137–147)

## 2013-11-18 LAB — CBC
HCT: 28.1 % — ABNORMAL LOW (ref 36.0–46.0)
Hemoglobin: 9.4 g/dL — ABNORMAL LOW (ref 12.0–15.0)
MCH: 31.6 pg (ref 26.0–34.0)
MCHC: 33.5 g/dL (ref 30.0–36.0)
MCV: 94.6 fL (ref 78.0–100.0)
PLATELETS: 149 10*3/uL — AB (ref 150–400)
RBC: 2.97 MIL/uL — ABNORMAL LOW (ref 3.87–5.11)
RDW: 20.4 % — AB (ref 11.5–15.5)
WBC: 3.2 10*3/uL — AB (ref 4.0–10.5)

## 2013-11-18 MED ORDER — DARBEPOETIN ALFA 40 MCG/0.4ML IJ SOSY: 40.0000 ug | PREFILLED_SYRINGE | INTRAMUSCULAR | Status: DC

## 2013-11-18 NOTE — Plan of Care (Signed)
Problem: Phase II Progression Outcomes Goal: IV changed to normal saline lock Outcome: Completed/Met Date Met:  11/18/13

## 2013-11-18 NOTE — Progress Notes (Signed)
Progress Note  Erika [name] KGU:[id number] DOB: [birth year] DOA: 11/13/2013 PCP: Dorian Heckle, MD   Brief narrative: 80 year old patient with known anemia suspected to be due to MDS, and diastolic congestive heart failure who was brought by her family to the hospital because of altered mentation.  After presentation was noted to be pale and tachycardic with heart rate in the 150s. She was noted to have diarrhea so C. difficile toxin was obtained. Her TNI was elevated at 2.26. Lactic acid was elevated at 4.23. She was not having any chest pain and her EKG was unremarkable. Her sodium and potassium were low and her BUN was mildly elevated at 23 with creatinine of 0.94 near baseline (0.77).  Chart review since admission reveals patient previously admitted September this year with anemia requiring transfusion (total of 3 units packed red blood cells). An extensive workup was obtained without any source of anemia identified. She was Hemoccult negative. Since that time she has followed up with hematologist Dr. Lindi Adie who diagnosed MDS clinically, forgoing BM Bx in this elderly patient.  Assessment/Plan:    Symptomatic anemia/Myelodysplastic syndrome Hemoglobin at presentation around 10 (hemoconcentrated)  Dropped to 6.8 10/29. 9.4 today after 2 units prbc. continue weekly aranesp. Appreciate Dr. Geralyn Flash consult. Transfer to SNF soon if remains stable. [facility] h[facility]h and [facility]p [facility]    Atrial fibrillation with RVR Related to dehydration, physiologic stress-converted to NSR. Not an anticoagulation candidate due to age, deconditioning, anemia    Acute encephalopathy Resolved-may have underlying undiagnosed mild dementia    Elevated troponin c/w demand ischemia further worsened by RVR which also decreased perfusion-trend is down. No further workup    Lactic acidosis /Dehydration with hyponatremia Resolved -Secondary to poor intake prior to admission-presented with hypotension and  elevated lactic acid as well as hyponatremia-noted patient's Cozaar was discontinued on 10/15 by primary care physician -IV fluids discontinued 10/29   Acute urinary retention Voiding on own    Abnormal urinalysis Urine culture negative-discontinue antibiotics    Malnutrition of moderate degree/FTT (failure to thrive) in adult Eating better    Elevated TSH Sick euthyroid syndrome-free T4 normal. Repeat as outpatient   Diarrhea c diff neg. None further. Had been on laxatives  Deconditioning: to SNF soon if h/h stable   DVT prophylaxis: SCDs Code Status: DO NOT RESUSCITATE Family Communication: son at bedside Disposition Plan/Expected LOS: SNF when bed available   Consultants: Hematology  Procedures: None  Antibiotics: Levaquin 10/27>10/29  Objective: Blood pressure 112/51, pulse 79, temperature 98 F (36.7 C), temperature source Oral, resp. rate 18, height 5\' 3"  (1.6 m), weight 69.854 kg (154 lb), SpO2 96 %.  Intake/Output Summary (Last 24 hours) at 11/18/13 0945 Last data filed at 11/17/13 1700  Gross per 24 hour  Intake    240 ml  Output      0 ml  Net    240 ml    Exam: Gen: a, o and appropriate. On commode Chest: Clear to auscultation bilaterally without WRR Cardiac: Regular rate and rhythm, S1-S2, no rubs murmurs or gallops Abdomen: Soft nontender nondistended without obvious hepatosplenomegaly, no ascites Extremities: no signif c/c/edema B LE  Scheduled Meds:  Scheduled Meds: . B-complex with vitamin C  1 tablet Oral Daily  . beta carotene w/minerals  1 tablet Oral Daily  . cholecalciferol  1,000 Units Oral Daily  . darbepoetin (ARANESP) injection - NON-DIALYSIS  40 mcg Subcutaneous Weekly  . docusate sodium  100 mg Oral BID  . feeding  supplement (ENSURE COMPLETE)  237 mL Oral TID BM  . megestrol  800 mg Oral Daily  . sodium chloride  3 mL Intravenous Q12H  . vitamin B-12  1,000 mcg Oral Daily  . vitamin C  1,000 mg Oral Daily   Data  Reviewed: Basic Metabolic Panel:  Recent Labs Lab 11/13/13 1600 11/14/13 0253 11/15/13 0422 11/17/13 0524 11/18/13 0455  NA 131* 129* 134* 136* 136*  K 2.9* 3.3* 3.7 4.6 4.3  CL 87* 90* 99 100 100  CO2 19 23 24 23 24   GLUCOSE 62* 74 104* 90 91  BUN 23 28* 21 15 16   CREATININE 0.94 1.23* 0.83 0.70 0.63  CALCIUM 9.4 8.0* 8.2* 8.7 8.9   Liver Function Tests:  Recent Labs Lab 11/15/13 0422  AST 20  ALT 17  ALKPHOS 74  BILITOT 0.4  PROT 5.7*  ALBUMIN 2.3*    CBC:  Recent Labs Lab 11/13/13 1559  11/15/13 0422 11/15/13 1130 11/16/13 0802 11/17/13 0524 11/18/13 0455  WBC 8.9  < > 6.3 4.5 3.2* 3.6* 3.2*  NEUTROABS 2.5  --   --   --  0.8*  --   --   HGB 10.6*  < > 7.8* 6.8* 9.4* 9.2* 9.4*  HCT 30.6*  < > 23.2* 20.6* 27.9* 27.4* 28.1*  MCV 96.5  < > 97.9 99.5 93.3 94.2 94.6  PLT 238  < > 155 155 137* 148* 149*  < > = values in this interval not displayed. Cardiac Enzymes:  Recent Labs Lab 11/13/13 2246 11/14/13 0253 11/14/13 0946  TROPONINI 2.26* 1.05* 0.70*   BNP (last 3 results)  Recent Labs  10/09/13 1251  PROBNP 1194.0*    Recent Results (from the past 240 hour(s))  Culture, blood (routine x 2)     Status: None (Preliminary result)   Collection Time: 11/13/13  4:00 PM  Result Value Ref Range Status   Specimen Description BLOOD ARM LEFT  Final   Special Requests BOTTLES DRAWN AEROBIC AND ANAEROBIC 10CC  Final   Culture  Setup Time   Final    11/13/2013 22:38 Performed at [facility]   Culture   Final           BLOOD CULTURE RECEIVED NO GROWTH TO DATE CULTURE WILL BE HELD FOR 5 DAYS BEFORE ISSUING A FINAL NEGATIVE REPORT Performed at [facility]   Report Status PENDING  Incomplete  Culture, blood (routine x 2)     Status: None (Preliminary result)   Collection Time: 11/13/13  4:08 PM  Result Value Ref Range Status   Specimen Description BLOOD ARM RIGHT  Final   Special Requests BOTTLES DRAWN AEROBIC AND ANAEROBIC 5CC  Final    Culture  Setup Time   Final    11/13/2013 22:37 Performed at [facility]   Culture   Final           BLOOD CULTURE RECEIVED NO GROWTH TO DATE CULTURE WILL BE HELD FOR 5 DAYS BEFORE ISSUING A FINAL NEGATIVE REPORT Performed at [facility]   Report Status PENDING  Incomplete  Urine culture     Status: None   Collection Time: 11/13/13  5:33 PM  Result Value Ref Range Status   Specimen Description URINE, CATHETERIZED  Final   Special Requests NONE  Final   Culture  Setup Time   Final    11/14/2013 00:19 Performed at [place] Performed at [facility]  Final   Culture NO GROWTH Performed at [facility]  Final   Report Status 11/15/2013 FINAL  Final  Clostridium Difficile by PCR     Status: None   Collection Time: 11/13/13  6:17 PM  Result Value Ref Range Status   C difficile by pcr NEGATIVE NEGATIVE Final  MRSA PCR Screening     Status: None   Collection Time: 11/13/13  9:16 PM  Result Value Ref Range Status   MRSA by PCR NEGATIVE NEGATIVE Final    Comment:        The GeneXpert MRSA Assay (FDA approved for NASAL specimens only), is one component of a comprehensive MRSA colonization surveillance program. It is not intended to diagnose MRSA infection nor to guide or monitor treatment for MRSA infections.      Time spent :  15 mins  [place] [facility] [phone]     If 7PM-7AM, please contact night-coverage [email or web address] Password [email or web address] 11/18/2013, 9:45 AM   LOS: 5 days

## 2013-11-19 LAB — CBC
HCT: 28.4 % — ABNORMAL LOW (ref 36.0–46.0)
Hemoglobin: 9.3 g/dL — ABNORMAL LOW (ref 12.0–15.0)
MCH: 31.2 pg (ref 26.0–34.0)
MCHC: 32.7 g/dL (ref 30.0–36.0)
MCV: 95.3 fL (ref 78.0–100.0)
Platelets: 147 10*3/uL — ABNORMAL LOW (ref 150–400)
RBC: 2.98 MIL/uL — AB (ref 3.87–5.11)
RDW: 20.5 % — ABNORMAL HIGH (ref 11.5–15.5)
WBC: 3.6 10*3/uL — AB (ref 4.0–10.5)

## 2013-11-19 LAB — CULTURE, BLOOD (ROUTINE X 2): Culture: NO GROWTH

## 2013-11-19 MED ORDER — DARBEPOETIN ALFA 40 MCG/0.4ML IJ SOSY: 40.0000 ug | PREFILLED_SYRINGE | INTRAMUSCULAR | Status: AC

## 2013-11-19 MED ORDER — ENSURE COMPLETE PO LIQD: 237.0000 mL | Freq: Three times a day (TID) | ORAL | Status: AC

## 2013-11-19 NOTE — Clinical Social Work Note (Signed)
Patient to be d/c'ed today to [facility].  Patient and family agreeable to plans will transport via patient's son.  RN to call report.  Patient and son glad to be going to their first choice.  Evette Cristal, MSW, [place]

## 2013-11-19 NOTE — Discharge Summary (Signed)
Physician Discharge Summary  Erika [name] JJK:[id number] DOB: October 02, 1919 DOA: 11/13/2013  PCP: Dorian Heckle, MD  Admit date: 11/13/2013 Discharge date: 11/19/2013  Time spent: 35 minutes  Recommendations for Outpatient Follow-up:  Weekly H/H and weekly aranesp- follow up with heme onc Repeat TSH/free t4 To SNF  Discharge Diagnoses:  Active Problems:   Symptomatic anemia   Myelodysplastic syndrome   Lactic acidosis   Atrial fibrillation with RVR   Malnutrition of moderate degree   Dehydration   FTT (failure to thrive) in adult   Acute encephalopathy   Abnormal urinalysis   Elevated TSH   Discharge Condition: improved  Diet recommendation:  cardiac  Filed Weights   11/13/13 2057 11/16/13 0352  Weight: 67.495 kg (148 lb 12.8 oz) 69.854 kg (154 lb)    History of present illness:  80 year old patient with known anemia suspected to be due to MDS, and diastolic congestive heart failure who was brought by her family to the hospital because of altered mentation.  After presentation was noted to be pale and tachycardic with heart rate in the 150s. She was noted to have diarrhea so C. difficile toxin was obtained. Her TNI was elevated at 2.26. Lactic acid was elevated at 4.23. She was not having any chest pain and her EKG was unremarkable. Her sodium and potassium were low and her BUN was mildly elevated at 23 with creatinine of 0.94 near baseline (0.77).  Chart review since admission reveals patient previously admitted September this year with anemia requiring transfusion (total of 3 units packed red blood cells). An extensive workup was obtained without any source of anemia identified. She was Hemoccult negative. Since that time she has followed up with hematologist Dr. Lindi Adie who diagnosed MDS clinically, forgoing BM Bx in this elderly patient.   Hospital Course:  Symptomatic anemia/Myelodysplastic syndrome Hemoglobin at presentation around 10 (hemoconcentrated) Dropped to  6.8 10/29. 9.4 today after 2 units prbc. continue weekly aranesp. Appreciate Dr. Geralyn Flash consult. Transfer to SNF  Needs weekly h/h and ar[facility]p at [facility]   Atrial fibrillation with RVR Related to dehydration, physiologic stress-converted to NSR. Not an anticoagulation candidate due to age, deconditioning, anemia   Acute encephalopathy Resolved-may have underlying undiagnosed mild dementia   Elevated troponin c/w demand ischemia further worsened by RVR which also decreased perfusion-trend is down. No further workup   Lactic acidosis /Dehydration with hyponatremia Resolved -Secondary to poor intake prior to admission-presented with hypotension and elevated lactic acid as well as hyponatremia-noted patient's Cozaar was discontinued on 10/15 by primary care physician -IV fluids discontinued 10/29   Abnormal urinalysis Urine culture negative-discontinue antibiotics   Malnutrition of moderate degree/FTT (failure to thrive) in adult Eating better   Elevated TSH Sick euthyroid syndrome-free T4 normal. Repeat as outpatient  Diarrhea c diff neg. None further. Had been on laxatives  Procedures:    Consultations:  Heme onc  Discharge Exam: Filed Vitals:   11/19/13 0815  BP: 104/81  Pulse: 90  Temp: 99.1 F (37.3 C)  Resp: 20    General: A+OX3, NAD Respiratory: clear  Discharge Instructions You were cared for by a hospitalist during your hospital stay. If you have any questions about your discharge medications or the care you received while you were in the hospital after you are discharged, you can call the unit and asked to speak with the hospitalist on call if the hospitalist that took care of you is not available. Once you are discharged, your primary care physician will handle any further medical  issues. Please note that NO REFILLS for any discharge medications will be authorized once you are discharged, as it is imperative that you return to your primary care physician  (or establish a relationship with a primary care physician if you do not have one) for your aftercare needs so that they can reassess your need for medications and monitor your lab values.  Discharge Instructions    Diet - low sodium heart healthy    Complete by:  As directed      Discharge instructions    Complete by:  As directed   Weekly H/H and aranesp TSH/free T4 repeat in 6 weeks     Increase activity slowly    Complete by:  As directed           Current Discharge Medication List    START taking these medications   Details  Darbepoetin Alfa (ARANESP) 40 MCG/0.4ML SOSY injection Inject 0.4 mLs (40 mcg total) into the skin every Thursday at 6pm. Qty: 8.4 mL    feeding supplement, ENSURE COMPLETE, (ENSURE COMPLETE) LIQD Take 237 mLs by mouth 3 (three) times daily between meals.      CONTINUE these medications which have NOT CHANGED   Details  Ascorbic Acid (VITAMIN C) 1000 MG tablet Take 1,000 mg by mouth daily.    b complex vitamins tablet Take 1 tablet by mouth daily.    beta carotene w/minerals (OCUVITE) tablet Take 1 tablet by mouth daily.    Cholecalciferol (VITAMIN D-3) 1000 UNITS CAPS Take 1 capsule by mouth daily.    vitamin B-12 (CYANOCOBALAMIN) 1000 MCG tablet Take 1,000 mcg by mouth daily.   Associated Diagnoses: Symptomatic anemia      STOP taking these medications     Glucosamine-Chondroitin (OSTEO BI-FLEX REGULAR STRENGTH PO)      losartan (COZAAR) 50 MG tablet        Allergies  Allergen Reactions  . Sulfa Antibiotics Other (See Comments)    unknown  . Penicillins Rash    Pt was on penicillin for 6 weeks in 1959 and developed a rash towards the latter stages of treatment   Follow-up Information    Follow up with Dorian Heckle, MD In 1 week.   Specialty:  Internal Medicine   Contact information:   [address] [place] [address] [phone]       Follow up with Rulon Eisenmenger, MD In 1 month.   Specialty:  Hematology  and Oncology   Contact information:   [place] [id number] [phone]        The results of significant diagnostics from this hospitalization (including imaging, microbiology, ancillary and laboratory) are listed below for reference.    Significant Diagnostic Studies: Dg Chest Portable 1 View  11/13/2013   CLINICAL DATA:  Short of breath  EXAM: PORTABLE CHEST - 1 VIEW  COMPARISON:  10/09/2013  FINDINGS: Cardiac enlargement without heart failure. Mild bibasilar atelectasis. Hiatal hernia. No effusion.  IMPRESSION: Mild bibasilar atelectasis  Hiatal hernia   Electronically Signed   By: Franchot Gallo M.D.   On: 11/13/2013 17:47    Microbiology: Recent Results (from the past 240 hour(s))  Culture, blood (routine x 2)     Status: None (Preliminary result)   Collection Time: 11/13/13  4:00 PM  Result Value Ref Range Status   Specimen Description BLOOD ARM LEFT  Final   Special Requests BOTTLES DRAWN AEROBIC AND ANAEROBIC 10CC  Final   Culture  Setup  Time   Final    11/13/2013 22:38 Performed at [place] Note: Culture results may be compromised due to an excessive volume of blood received in culture bottles. Gram Stain Report Called to,Read Back By and Verified With: [facility] LESSARD 11/18/13 @ 2:26PM BY RUSCOE A. Performed at [facility]    Report Status PENDING  Incomplete  Culture, blood (routine x 2)     Status: None (Preliminary result)   Collection Time: 11/13/13  4:08 PM  Result Value Ref Range Status   Specimen Description BLOOD ARM RIGHT  Final   Special Requests BOTTLES DRAWN AEROBIC AND ANAEROBIC 5CC  Final   Culture  Setup Time   Final    11/13/2013 22:37 Performed at [facility]   Culture   Final           BLOOD CULTURE RECEIVED NO GROWTH TO DATE CULTURE WILL BE HELD FOR 5 DAYS BEFORE ISSUING A FINAL NEGATIVE REPORT Performed at [facility]   Report Status PENDING   Incomplete  Urine culture     Status: None   Collection Time: 11/13/13  5:33 PM  Result Value Ref Range Status   Specimen Description URINE, CATHETERIZED  Final   Special Requests NONE  Final   Culture  Setup Time   Final    11/14/2013 00:19 Performed at [place] Performed at [facility]  Final   Culture NO GROWTH Performed at [facility]  Final   Report Status 11/15/2013 FINAL  Final  Clostridium Difficile by PCR     Status: None   Collection Time: 11/13/13  6:17 PM  Result Value Ref Range Status   C difficile by pcr NEGATIVE NEGATIVE Final  MRSA PCR Screening     Status: None   Collection Time: 11/13/13  9:16 PM  Result Value Ref Range Status   MRSA by PCR NEGATIVE NEGATIVE Final    Comment:        The GeneXpert MRSA Assay (FDA approved for NASAL specimens only), is one component of a comprehensive MRSA colonization surveillance program. It is not intended to diagnose MRSA infection nor to guide or monitor treatment for MRSA infections.     Labs: Basic Metabolic Panel:  Recent Labs Lab 11/13/13 1600 11/14/13 0253 11/15/13 0422 11/17/13 0524 11/18/13 0455  NA 131* 129* 134* 136* 136*  K 2.9* 3.3* 3.7 4.6 4.3  CL 87* 90* 99 100 100  CO2 19 23 24 23 24   GLUCOSE 62* 74 104* 90 91  BUN 23 28* 21 15 16   CREATININE 0.94 1.23* 0.83 0.70 0.63  CALCIUM 9.4 8.0* 8.2* 8.7 8.9   Liver Function Tests:  Recent Labs Lab 11/15/13 0422  AST 20  ALT 17  ALKPHOS 74  BILITOT 0.4  PROT 5.7*  ALBUMIN 2.3*   No results for input(s): LIPASE, AMYLASE in the last 168 hours. No results for input(s): AMMONIA in the last 168 hours. CBC:  Recent Labs Lab 11/13/13 1559  11/15/13 1130 11/16/13 0802 11/17/13 0524 11/18/13 0455 11/19/13 0345  WBC 8.9  < > 4.5 3.2* 3.6* 3.2* 3.6*  NEUTROABS 2.5  --   --  0.8*  --   --   --   HGB 10.6*  < > 6.8* 9.4* 9.2* 9.4* 9.3*  HCT 30.6*  < > 20.6* 27.9* 27.4* 28.1* 28.4*  MCV  96.5  < >  99.5 93.3 94.2 94.6 95.3  PLT 238  < > 155 137* 148* 149* 147*  < > = values in this interval not displayed. Cardiac Enzymes:  Recent Labs Lab 11/13/13 2246 11/14/13 0253 11/14/13 0946  TROPONINI 2.26* 1.05* 0.70*   BNP: BNP (last 3 results)  Recent Labs  10/09/13 1251  PROBNP 1194.0*   CBG:  Recent Labs Lab 11/14/13 2317  GLUCAP 113*       Signed:  Avana Kreiser  [facility] 11/19/2013, 10:17 AM

## 2013-11-19 NOTE — Progress Notes (Signed)
Physical Therapy Treatment Patient Details Name: Erika [name] MRN: [id number] DOB: [birth year] Today's Date: 11/19/2013    History of Present Illness Pt is a 80 yo female presenting with anemia due to suspected MDS and diastolic CHF. Pt also with afib with RVR, acute encephalopathy, lactic acidosis, acute urinary retention. Pt with hx of heart murmur, anemia, migraien, and arthritis.     PT Comments    Patient is progressing towards physical therapy goals. Tolerates therapeutic exercises and gait training today without complaints of dizziness; HR to 103 and SpO2 99% on room air. Pt motivated to do well at rehab so she can return home as soon as possible. Patient will continue to benefit from skilled physical therapy services to further improve independence with functional mobility.    Follow Up Recommendations  SNF;Supervision/Assistance - 24 hour     Equipment Recommendations  Other (comment) (TBD)    Recommendations for Other Services       Precautions / Restrictions Precautions Precautions: Fall Restrictions Weight Bearing Restrictions: No    Mobility  Bed Mobility                  Transfers Overall transfer level: Needs assistance Equipment used: Rolling walker (2 wheeled) Transfers: Sit to/from Stand Sit to Stand: Min guard         General transfer comment: Min guard for safety. VC for hand placement. Performed from lowest bed setting x2. Good stability when holding RW upon standing. Cues for walker placement closer to base of support.  Ambulation/Gait Ambulation/Gait assistance: Min guard Ambulation Distance (Feet): 130 Feet Assistive device: Rolling walker (2 wheeled) Gait Pattern/deviations: Step-through pattern;Decreased stride length;Trunk flexed Gait velocity: slow   General Gait Details: Intermittent VC for upright posture with forward gaze. Good control of rolling walker. Reports mild fatigue at end of ambulatory distance. HR to 103, SpO2 99% on  room air. No dizziness reported.   Stairs            Wheelchair Mobility    Modified Rankin (Stroke Patients Only)       Balance                                    Cognition Arousal/Alertness: Awake/alert Behavior During Therapy: WFL for tasks assessed/performed Overall Cognitive Status: Within Functional Limits for tasks assessed                      Exercises General Exercises - Lower Extremity Ankle Circles/Pumps: AROM;Both;10 reps;Seated Long Arc Quad: Strengthening;Both;10 reps;Seated Hip Flexion/Marching: Strengthening;Both;10 reps;Seated;Standing (10x seated 10x standing) Mini-Sqauts: Strengthening;Both;10 reps;Standing    General Comments        Pertinent Vitals/Pain Pain Assessment: No/denies pain    Home Living                      Prior Function            PT Goals (current goals can now be found in the care plan section) Acute Rehab PT Goals Patient Stated Goal: to go home  PT Goal Formulation: With patient/family Time For Goal Achievement: 11/29/13 Potential to Achieve Goals: Fair Progress towards PT goals: Progressing toward goals    Frequency  Min 2X/week    PT Plan Current plan remains appropriate    Co-evaluation             End of Session Equipment Utilized During  Treatment: Gait belt Activity Tolerance: Patient tolerated treatment well Patient left: in bed;with call bell/phone within reach     Time: 1400-1412 PT Time Calculation (min): 12 min  Charges:  $Gait Training: 8-22 mins                    G Codes:      Ellouise Newer 2013/12/02, 2:23 PM  Camille Bal [place], [place]

## 2013-11-19 NOTE — Progress Notes (Signed)
Occupational Therapy Treatment Patient Details Name: Erika [name] MRN: [id number] DOB: [birth year] Today's Date: 11/19/2013    History of present illness Pt is a 80 yo female presenting with anemia due to suspected MDS and diastolic CHF. Pt also with afib with RVR, acute encephalopathy, lactic acidosis, acute urinary retention. Pt with hx of heart murmur, anemia, migraien, and arthritis.    OT comments  Pt fatigued after working with PT, but willing to work with OT.  She is eager to discharge to SNF so she can get strong enough to return home.  Currently, she is requiring min guard assist with BADLs.  She does fatigue and requires rest breaks.  Did not overly challenge pt this session due to pending discharge and did not want to exhaust pt energy.  Continue to recommend SNF.   Follow Up Recommendations  SNF    Equipment Recommendations  None recommended by OT    Recommendations for Other Services      Precautions / Restrictions Precautions Precautions: Fall Restrictions Weight Bearing Restrictions: No       Mobility Bed Mobility   Bed Mobility: Supine to Sit     Supine to sit: Supervision        Transfers Overall transfer level: Needs assistance Equipment used: Rolling walker (2 wheeled) Transfers: Sit to/from Stand Sit to Stand: Min guard Stand pivot transfers: Min guard       General transfer comment: Pt requires min guard assist for safety and balance.     Balance Overall balance assessment: Needs assistance Sitting-balance support: Feet supported Sitting balance-Leahy Scale: Good     Standing balance support: During functional activity Standing balance-Leahy Scale: Fair                     ADL Overall ADL's : Needs assistance/impaired Eating/Feeding: Independent   Grooming: Wash/dry hands;Brushing hair;Min guard;Standing               Lower Body Dressing: Min guard;Sit to/from stand   Toilet Transfer: Min guard;Ambulation;RW    Toileting- Water quality scientist and Hygiene: Min guard;Sit to/from stand       Functional mobility during ADLs: Min guard;Rolling walker General ADL Comments: Pt fatigued due to working with PT earlier. She is awaiting discharge and transfer to SNF.  She continues to require min guard assist for BADLs due to mild balance deficits and she fatigues placing her at risk for fall.       Vision                     Perception     Praxis      Cognition   Behavior During Therapy: [facility] for tasks assessed/performed Overall Cognitive Status: Within Functional Limits for tasks assessed                       Extremity/Trunk Assessment               Exercises General Exercises - Lower Extremity Ankle Circles/Pumps: AROM;Both;10 reps;Seated Long Arc Quad: Strengthening;Both;10 reps;Seated Hip Flexion/Marching: Strengthening;Both;10 reps;Seated;Standing (10x seated 10x standing) Mini-Sqauts: Strengthening;Both;10 reps;Standing   Shoulder Instructions       General Comments      Pertinent Vitals/ Pain       Pain Assessment: No/denies pain  Home Living  Prior Functioning/Environment              Frequency Min 2X/week     Progress Toward Goals  OT Goals(current goals can now be found in the care plan section)  Progress towards OT goals: Progressing toward goals  Acute Rehab OT Goals Patient Stated Goal: to go home  ADL Goals Pt Will Perform Grooming: with modified independence;standing Pt Will Perform Upper Body Bathing: with modified independence;sitting Pt Will Perform Lower Body Bathing: with modified independence;sit to/from stand Pt Will Perform Upper Body Dressing: with modified independence;sitting Pt Will Perform Lower Body Dressing: with modified independence;sit to/from stand Pt Will Transfer to Toilet: with modified independence;ambulating;regular height toilet;grab bars Pt Will  Perform Toileting - Clothing Manipulation and hygiene: with modified independence;sit to/from stand  Plan Discharge plan remains appropriate    Co-evaluation                 End of Session Equipment Utilized During Treatment: Rolling walker   Activity Tolerance Patient tolerated treatment well   Patient Left in chair;with call bell/phone within reach;with family/visitor present   Nurse Communication Mobility status        Time: 1610-9604 OT Time Calculation (min): 15 min  Charges: OT General Charges $OT Visit: 1 Procedure OT Treatments $Self Care/Home Management : 8-22 mins  [name] M 11/19/2013, 3:41 PM

## 2013-11-20 ENCOUNTER — Telehealth: Payer: Self-pay

## 2013-11-20 ENCOUNTER — Telehealth: Payer: Self-pay | Admitting: Hematology and Oncology

## 2013-11-20 NOTE — Telephone Encounter (Signed)
, °

## 2013-11-20 NOTE — Telephone Encounter (Signed)
Returned pt son call re: follow up.  One month fu from hospital discharge.  Appt made 10/27 lab 830, VG 9, injection 915.  Appts for 11/6 cancelled.  POF entered.  Pt son will notify Ritta Slot of d/t for appt.

## 2013-11-22 LAB — CULTURE, BLOOD (ROUTINE X 2)

## 2013-11-23 ENCOUNTER — Ambulatory Visit: Payer: Medicare Other

## 2013-11-23 ENCOUNTER — Other Ambulatory Visit: Payer: Medicare Other

## 2013-11-23 ENCOUNTER — Ambulatory Visit: Payer: Medicare Other | Admitting: Oncology

## 2013-11-30 ENCOUNTER — Telehealth: Payer: Self-pay

## 2013-11-30 NOTE — Telephone Encounter (Signed)
Rerturned call to Lewis Shock at [facility].  Pt discharged to home today from [facility].  Edwina requesting clarification on Aranesp orders.  Per Bethena Roys, orders at [place] were weekly aranesp.  She reports 11/3 Hgb wa 10.3 - no aranesp given.  11/10 - Hgb 8.9, pt given Aranesp.  Confirmed Dr Geralyn Flash ofc note planned on CBC and aranesp q3 wks, next appt at clinic at 11/30.  Let Bethena Roys know I would call her if there was any change to Q3 wk aranesp orders.

## 2013-12-03 NOTE — Telephone Encounter (Signed)
Verified with Dr Lindi Adie that aranesp and CBC would be every 3 weeks.

## 2013-12-04 ENCOUNTER — Telehealth: Payer: Self-pay

## 2013-12-04 NOTE — Telephone Encounter (Signed)
Returned call to Erika [name] at [facility] who states that pt insurance does not provide coverage for Aranesp and that MD needs to request a formulary exception at [phone].  Erika [name] reports speaking with pt son Erika [name] about this with home health people there.  [place] who reports no pre-authorization is required for Aranesp under her plan.   LMOVM for Erika [name] that our information indicates pre-auth not required and if he is having a problem to contact me so I can connect him to the right people at the clinic to resolve the problem.

## 2013-12-11 ENCOUNTER — Other Ambulatory Visit: Payer: Self-pay

## 2013-12-11 ENCOUNTER — Telehealth: Payer: Self-pay

## 2013-12-11 ENCOUNTER — Telehealth: Payer: Self-pay | Admitting: Hematology and Oncology

## 2013-12-11 DIAGNOSIS — D469 Myelodysplastic syndrome, unspecified: Secondary | ICD-10-CM

## 2013-12-11 NOTE — Progress Notes (Signed)
Per Dr Lindi Adie, bring pt in to see him with labs.  POF sent for 11/25 lab at 1045, OV 1100.

## 2013-12-11 NOTE — Telephone Encounter (Signed)
per pof to sch pt appt-per terri pt aware

## 2013-12-11 NOTE — Telephone Encounter (Signed)
Pt son Kyung Rudd called re: appt and fluids.  Confirmed appt d/t with son on 11/30.  Made lab appt for 11/25 and confirmed with son.  Pt concerned that pt is not getting enough fluids.  He is being told by [facility] that they can't get orders from Dr. Lindi Adie.  Let him know I would check with Dr Lindi Adie and call him back.  Pt voiced understanding.

## 2013-12-12 ENCOUNTER — Ambulatory Visit (HOSPITAL_BASED_OUTPATIENT_CLINIC_OR_DEPARTMENT_OTHER): Payer: Medicare Other | Admitting: Hematology and Oncology

## 2013-12-12 ENCOUNTER — Telehealth: Payer: Self-pay | Admitting: Hematology and Oncology

## 2013-12-12 ENCOUNTER — Ambulatory Visit (HOSPITAL_BASED_OUTPATIENT_CLINIC_OR_DEPARTMENT_OTHER): Payer: Medicare Other

## 2013-12-12 ENCOUNTER — Ambulatory Visit: Payer: Medicare Other

## 2013-12-12 ENCOUNTER — Ambulatory Visit (HOSPITAL_COMMUNITY)
Admission: RE | Admit: 2013-12-12 | Discharge: 2013-12-12 | Disposition: A | Discharge status: Home / Self Care | Payer: Medicare Other | Source: Ambulatory Visit | Attending: Hematology and Oncology | Admitting: Hematology and Oncology

## 2013-12-12 ENCOUNTER — Other Ambulatory Visit: Payer: Medicare Other

## 2013-12-12 ENCOUNTER — Other Ambulatory Visit (HOSPITAL_BASED_OUTPATIENT_CLINIC_OR_DEPARTMENT_OTHER): Payer: Medicare Other

## 2013-12-12 VITALS — BP 135/50 | HR 92 | Temp 98.4°F | Resp 18

## 2013-12-12 VITALS — BP 81/48 | HR 121 | Temp 98.4°F | Resp 18 | Ht 63.0 in | Wt 147.0 lb

## 2013-12-12 DIAGNOSIS — D649 Anemia, unspecified: Secondary | ICD-10-CM

## 2013-12-12 DIAGNOSIS — R0602 Shortness of breath: Secondary | ICD-10-CM | POA: Diagnosis not present

## 2013-12-12 DIAGNOSIS — Z883 Allergy status to other anti-infective agents status: Secondary | ICD-10-CM | POA: Diagnosis not present

## 2013-12-12 DIAGNOSIS — Z880 Allergy status to penicillin: Secondary | ICD-10-CM | POA: Insufficient documentation

## 2013-12-12 DIAGNOSIS — Z882 Allergy status to sulfonamides status: Secondary | ICD-10-CM | POA: Insufficient documentation

## 2013-12-12 DIAGNOSIS — D469 Myelodysplastic syndrome, unspecified: Secondary | ICD-10-CM

## 2013-12-12 DIAGNOSIS — D539 Nutritional anemia, unspecified: Secondary | ICD-10-CM | POA: Insufficient documentation

## 2013-12-12 DIAGNOSIS — Z79899 Other long term (current) drug therapy: Secondary | ICD-10-CM | POA: Diagnosis not present

## 2013-12-12 DIAGNOSIS — D509 Iron deficiency anemia, unspecified: Secondary | ICD-10-CM

## 2013-12-12 DIAGNOSIS — I959 Hypotension, unspecified: Secondary | ICD-10-CM

## 2013-12-12 LAB — TECHNOLOGIST REVIEW

## 2013-12-12 LAB — CBC WITH DIFFERENTIAL/PLATELET
BASO%: 0 % (ref 0.0–2.0)
BASOS ABS: 0 10*3/uL (ref 0.0–0.1)
EOS%: 0.2 % (ref 0.0–7.0)
Eosinophils Absolute: 0 10*3/uL (ref 0.0–0.5)
HEMATOCRIT: 19.9 % — AB (ref 34.8–46.6)
HEMOGLOBIN: 6.7 g/dL — AB (ref 11.6–15.9)
LYMPH%: 50.5 % — AB (ref 14.0–49.7)
MCH: 30.7 pg (ref 25.1–34.0)
MCHC: 33.7 g/dL (ref 31.5–36.0)
MCV: 91.3 fL (ref 79.5–101.0)
MONO#: 1.2 10*3/uL — AB (ref 0.1–0.9)
MONO%: 29.4 % — ABNORMAL HIGH (ref 0.0–14.0)
NEUT%: 19.9 % — AB (ref 38.4–76.8)
NEUTROS ABS: 0.8 10*3/uL — AB (ref 1.5–6.5)
PLATELETS: 151 10*3/uL (ref 145–400)
RBC: 2.18 10*6/uL — ABNORMAL LOW (ref 3.70–5.45)
RDW: 18.9 % — ABNORMAL HIGH (ref 11.2–14.5)
WBC: 4.1 10*3/uL (ref 3.9–10.3)
lymph#: 2.1 10*3/uL (ref 0.9–3.3)
nRBC: 0 % (ref 0–0)

## 2013-12-12 LAB — COMPREHENSIVE METABOLIC PANEL (CC13)
ALT: 23 U/L (ref 0–55)
ANION GAP: 9 meq/L (ref 3–11)
AST: 20 U/L (ref 5–34)
Albumin: 2.5 g/dL — ABNORMAL LOW (ref 3.5–5.0)
Alkaline Phosphatase: 107 U/L (ref 40–150)
BUN: 19.7 mg/dL (ref 7.0–26.0)
CHLORIDE: 100 meq/L (ref 98–109)
CO2: 20 mEq/L — ABNORMAL LOW (ref 22–29)
CREATININE: 1.1 mg/dL (ref 0.6–1.1)
Calcium: 9 mg/dL (ref 8.4–10.4)
Glucose: 121 mg/dl (ref 70–140)
Potassium: 3.9 mEq/L (ref 3.5–5.1)
Sodium: 130 mEq/L — ABNORMAL LOW (ref 136–145)
Total Bilirubin: 0.61 mg/dL (ref 0.20–1.20)
Total Protein: 6.7 g/dL (ref 6.4–8.3)

## 2013-12-12 LAB — PREPARE RBC (CROSSMATCH)

## 2013-12-12 LAB — HOLD TUBE, BLOOD BANK

## 2013-12-12 MED ORDER — SODIUM CHLORIDE 0.9 % IV SOLN
250.0000 mL | Freq: Once | INTRAVENOUS | Status: AC
  Administered 2013-12-12: 250 mL via INTRAVENOUS

## 2013-12-12 MED ORDER — ACETAMINOPHEN 325 MG PO TABS
ORAL_TABLET | ORAL | Status: AC
  Filled 2013-12-12: qty 2

## 2013-12-12 MED ORDER — DARBEPOETIN ALFA 500 MCG/ML IJ SOSY
500.0000 ug | PREFILLED_SYRINGE | Freq: Once | INTRAMUSCULAR | Status: AC
  Administered 2013-12-12: 500 ug via SUBCUTANEOUS
  Filled 2013-12-12: qty 1

## 2013-12-12 MED ORDER — UNABLE TO FIND: 1.0000 | Status: AC | PRN

## 2013-12-12 MED ORDER — ACETAMINOPHEN 325 MG PO TABS
650.0000 mg | ORAL_TABLET | Freq: Once | ORAL | Status: AC
  Administered 2013-12-12: 650 mg via ORAL

## 2013-12-12 NOTE — Addendum Note (Signed)
Addended by: Prentiss Bells on: 12/12/2013 11:28 AM   Modules accepted: Orders, Medications

## 2013-12-12 NOTE — Telephone Encounter (Signed)
, °

## 2013-12-12 NOTE — Assessment & Plan Note (Signed)
Myelodysplastic syndrome Severe macrocytic anemia with normal T91, folic acid I suspect myelodysplastic syndrome based on clinical presentation and findings. Patient continues to have recurrent anemia symptoms and is here today for blood count check. Her hemoglobin is 6.7. We will type and cross and transfuse 2 units of packed red cells. Currently on Aranesp injections once every 4 weeks his hemoglobin was less than 10 g.  Return to clinic once a month for blood count check transfusion as needed and Aranesp injections.

## 2013-12-12 NOTE — Progress Notes (Signed)
Patient Care Team: Dorian Heckle, MD as PCP - General (Internal Medicine)  DIAGNOSIS: DIAGNOSIS: Macrocytic anemia most likely myelodysplastic syndrome with refractory anemia TREATMENT: Aranesp q. 4 weeks, blood transfusions as needed  CHIEF COMPLIANT: severe fatigue and shortness of breath  INTERVAL HISTORY: Erika [name] is a 80 year old Caucasian with above-mentioned history of chronic macrocytic anemia that required previous blood transfusions. Last transfusion was 11/03/2013. She was in the nursing home that she was getting once a week Aranesp injections. Her hemoglobin was holding steady on 9 g. Her last Aranesp injection was 11/27/2013 per hemoglobin of 8.9. She called in complaining of severe fatigue and shortness of breath with minimal exertion and inability to do anything at home. So we instructed her to come in today for followup. She was found to be severely anemic with a hemoglobin of 6.7.  REVIEW OF SYSTEMS:   Constitutional: severe fatigue Eyes: Denies blurriness of vision Ears, nose, mouth, throat, and face: Denies mucositis or sore throat Respiratory: shortness of breath Cardiovascular: palpitations Gastrointestinal:  Denies nausea, heartburn or change in bowel habits Skin: Denies abnormal skin rashes Lymphatics: Denies new lymphadenopathy or easy bruising Neurological:Denies numbness, tingling or new weaknesses Behavioral/Psych: frail And sat  All other systems were reviewed with the patient and are negative.  I have reviewed the past medical history, past surgical history, social history and family history with the patient and they are unchanged from previous note.  ALLERGIES:  is allergic to sulfa antibiotics and penicillins.  MEDICATIONS:  Current Outpatient Prescriptions  Medication Sig Dispense Refill  . Ascorbic Acid (VITAMIN C) 1000 MG tablet Take 1,000 mg by mouth daily.    Marland Kitchen b complex vitamins tablet Take 1 tablet by mouth daily.    . beta carotene  w/minerals (OCUVITE) tablet Take 1 tablet by mouth daily.    . Cholecalciferol (VITAMIN D-3) 1000 UNITS CAPS Take 1 capsule by mouth daily.    . Darbepoetin Alfa (ARANESP) 40 MCG/0.4ML SOSY injection Inject 0.4 mLs (40 mcg total) into the skin every Thursday at 6pm. 8.4 mL   . feeding supplement, ENSURE COMPLETE, (ENSURE COMPLETE) LIQD Take 237 mLs by mouth 3 (three) times daily between meals.    . vitamin B-12 (CYANOCOBALAMIN) 1000 MCG tablet Take 1,000 mcg by mouth daily.     No current facility-administered medications for this visit.    PHYSICAL EXAMINATION: ECOG PERFORMANCE STATUS: 3 - Symptomatic, >50% confined to bed  Filed Vitals:   12/12/13 1054  BP: 81/48  Pulse: 121  Temp: 98.4 F (36.9 C)  Resp: 18   Filed Weights   12/12/13 1054  Weight: 147 lb (66.679 kg)    GENERAL:alert, no distress and comfortable SKIN: skin color, texture, turgor are normal, no rashes or significant lesions EYES: normal, Conjunctiva are pink and non-injected, sclera clear OROPHARYNX:no exudate, no erythema and lips, buccal mucosa, and tongue normal  NECK: supple, thyroid normal size, non-tender, without nodularity LYMPH:  no palpable lymphadenopathy in the cervical, axillary or inguinal LUNGS: clear to auscultation and percussion with normal breathing effort HEART: regular rate & rhythm and no murmurs and no lower extremity edema ABDOMEN:abdomen soft, non-tender and normal bowel sounds Musculoskeletal:no cyanosis of digits and no clubbing  NEURO: alert & oriented x 3 with fluent speech, no focal motor/sensory deficits  LABORATORY DATA:  I have reviewed the data as listed   Chemistry      Component Value Date/Time   NA 136* 11/18/2013 0455   K 4.3 11/18/2013 0455   CL  100 11/18/2013 0455   CO2 24 11/18/2013 0455   BUN 16 11/18/2013 0455   CREATININE 0.63 11/18/2013 0455      Component Value Date/Time   CALCIUM 8.9 11/18/2013 0455   ALKPHOS 74 11/15/2013 0422   AST 20 11/15/2013  0422   ALT 17 11/15/2013 0422   BILITOT 0.4 11/15/2013 0422       Lab Results  Component Value Date   WBC 4.1 12/12/2013   HGB 6.7* 12/12/2013   HCT 19.9* 12/12/2013   MCV 91.3 12/12/2013   PLT 151 12/12/2013   NEUTROABS 0.8* 12/12/2013    ASSESSMENT & PLAN:   Myelodysplastic syndrome Severe macrocytic anemia with normal A19, folic acid I suspect myelodysplastic syndrome based on clinical presentation and findings. Patient continues to have recurrent anemia symptoms and is here today for blood count check. Her hemoglobin is 6.7. We will type and cross and transfuse 2 units of packed red cells. Currently on Aranesp injections once every 4 weeks his hemoglobin was less than 10 g.  Return to clinic once a month for blood count check transfusion as needed and Aranesp injections.  patient has hypotension related to severe anemia. I discussed with her that hopefully blood transfusion will help her blood pressure as well. She is not on any antihypertensives.  Orders Placed This Encounter  Procedures  . CBC with Differential    Standing Status: Standing     Number of Occurrences: 12     Standing Expiration Date: 12/13/2014  . Hold Tube, Blood Bank    Standing Status: Standing     Number of Occurrences: 12     Standing Expiration Date: 12/13/2014   The patient has a good understanding of the overall plan. she agrees with it. She will call with any problems that may develop before her next visit here.   Rulon Eisenmenger, MD 12/12/2013 11:14 AM

## 2013-12-12 NOTE — Patient Instructions (Signed)

## 2013-12-13 LAB — TYPE AND SCREEN
ABO/RH(D): A POS
Antibody Screen: NEGATIVE
Unit division: 0
Unit division: 0

## 2013-12-14 ENCOUNTER — Other Ambulatory Visit: Payer: Medicare Other

## 2013-12-14 ENCOUNTER — Ambulatory Visit: Payer: Medicare Other

## 2013-12-17 ENCOUNTER — Ambulatory Visit: Payer: Medicare Other

## 2013-12-17 ENCOUNTER — Other Ambulatory Visit: Payer: Medicare Other

## 2013-12-17 ENCOUNTER — Ambulatory Visit: Payer: Medicare Other | Admitting: Hematology and Oncology

## 2013-12-17 ENCOUNTER — Telehealth: Payer: Self-pay

## 2013-12-17 NOTE — Telephone Encounter (Signed)
Msg rcvd from heather rn [facility].  Returned call spoke /elaine rn.  Let elaine know dr wants lab draws and injections monthly at clinic.  She voiced understanding.

## 2013-12-24 ENCOUNTER — Telehealth: Payer: Self-pay | Admitting: *Deleted

## 2013-12-24 NOTE — Telephone Encounter (Signed)
Patient's son Berneta Sages called stating that patient has declined significantly overnight. Requesting referral to hospice. Spoke with Dr. Lindi Adie and hospice called. Advised patient's son that hospice would see the patient today. He verbalized understanding.

## 2013-12-25 ENCOUNTER — Telehealth: Payer: Self-pay | Admitting: *Deleted

## 2013-12-31 ENCOUNTER — Encounter: Payer: Self-pay | Admitting: *Deleted

## 2013-12-31 NOTE — Progress Notes (Signed)
Received clinical notes from [facility]. Sent to scan.

## 2014-01-02 ENCOUNTER — Telehealth: Payer: Self-pay

## 2014-01-02 NOTE — Telephone Encounter (Signed)
Discharge summary rcvd from [facility] 12/26/13.  Copy to Dr Lindi Adie.  Sent to scan.

## 2014-01-08 ENCOUNTER — Ambulatory Visit: Payer: Medicare Other

## 2014-01-08 ENCOUNTER — Ambulatory Visit: Payer: Medicare Other | Admitting: Hematology and Oncology

## 2014-01-08 ENCOUNTER — Other Ambulatory Visit: Payer: Medicare Other

## 2014-01-18 NOTE — Telephone Encounter (Signed)
Received call from [facility] that patient is DNR.

## 2014-01-18 NOTE — Telephone Encounter (Signed)
Received call from Lattie Haw with [facility] that patient expired today.

## 2014-01-18 DEATH — deceased

## 2015-07-09 ENCOUNTER — Other Ambulatory Visit: Payer: Self-pay | Admitting: Nurse Practitioner

## 2016-02-12 IMAGING — CT CT ABD-PELV W/O CM
2 of 4 series · 12 of 46 positions shown, 14 images · non-contrast
Comparison: None.

CLINICAL DATA: Epigastric pain.

EXAM:
CT ABDOMEN AND PELVIS WITHOUT CONTRAST
TECHNIQUE: Multidetector CT imaging of the abdomen and pelvis was performed
following the standard protocol without IV contrast.

[Series 201: routine, idose (2) · axial · 0.69mm/px · z∈[-469,-104]mm · 9 of 89 slices shown, 11 images]
[im 8/89  soft-tissue]
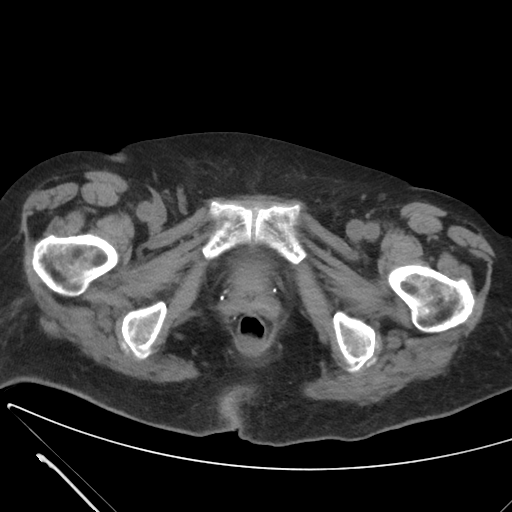
[im 8/89  bone]
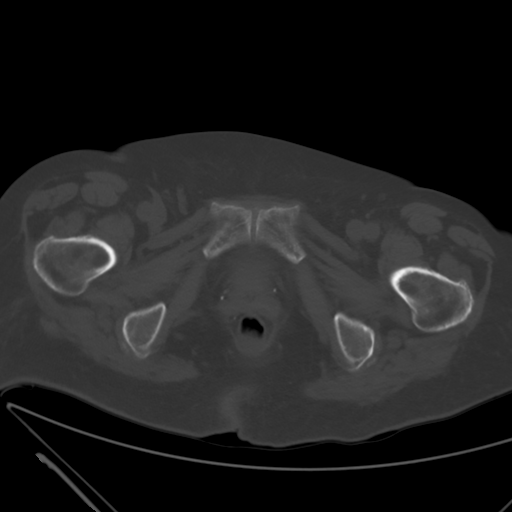
[im 15/89  soft-tissue]
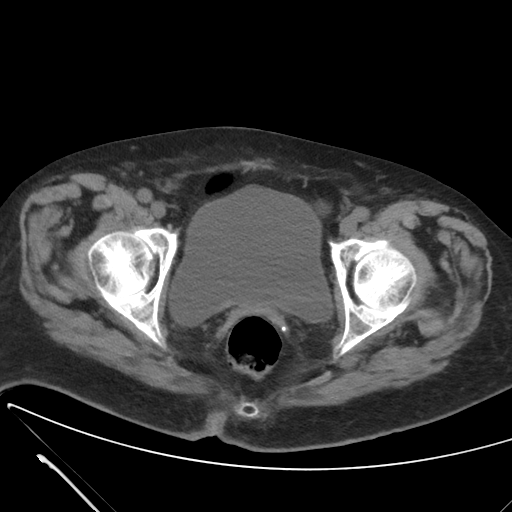
[im 26/89  soft-tissue]
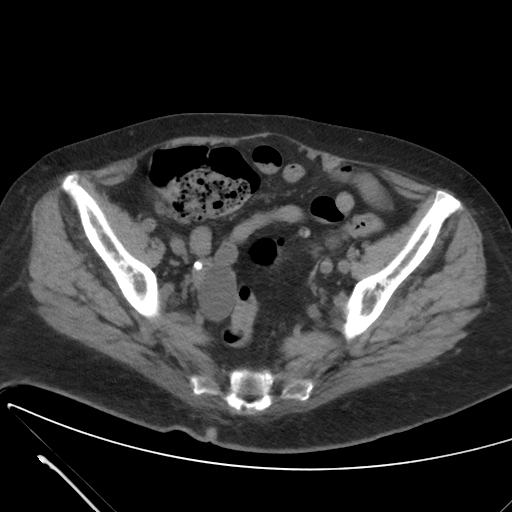
[im 34/89  soft-tissue]
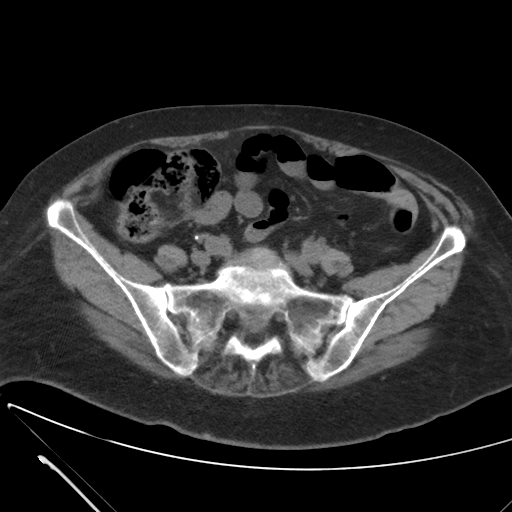
[im 45/89  soft-tissue]
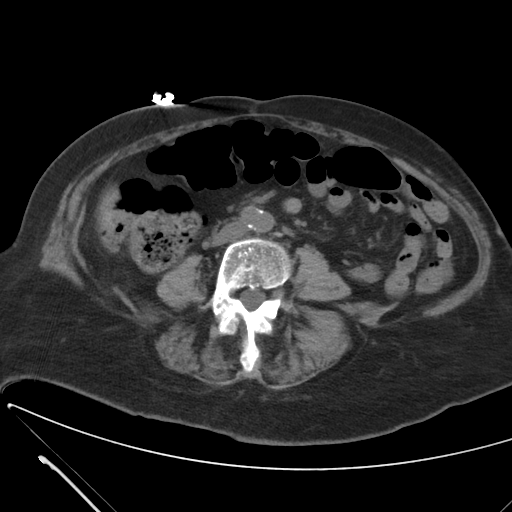
[im 56/89  soft-tissue]
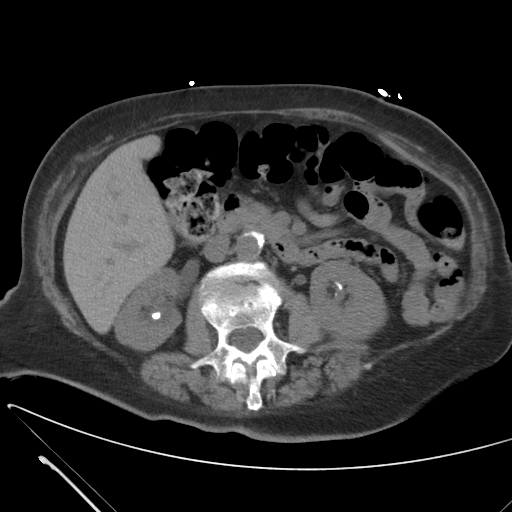
[im 63/89  soft-tissue]
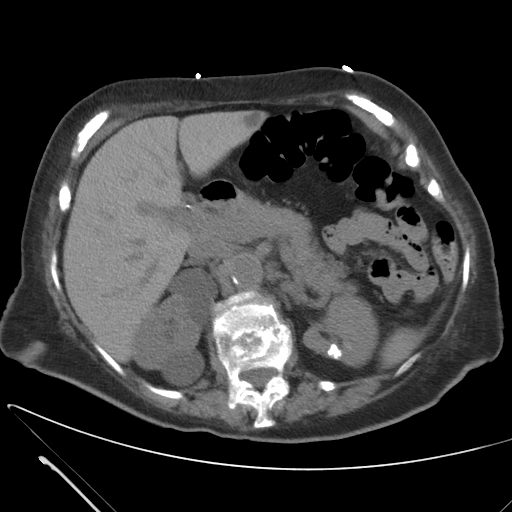
[im 74/89  soft-tissue]
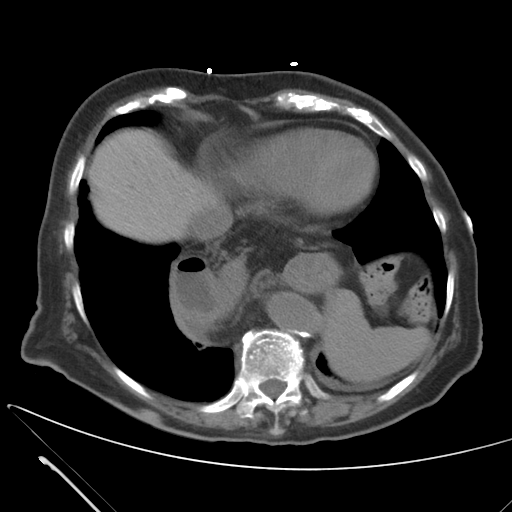
[im 81/89  soft-tissue]
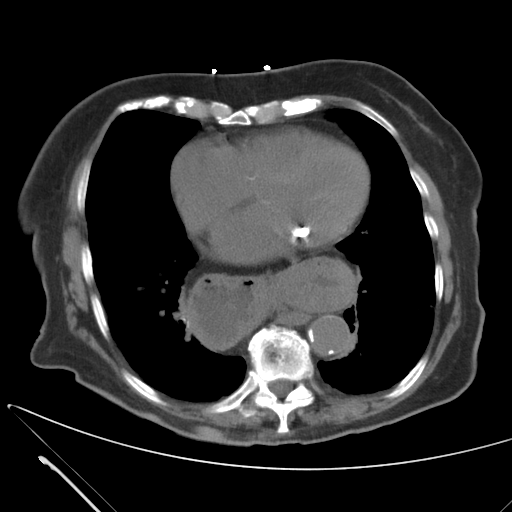
[im 81/89  bone]
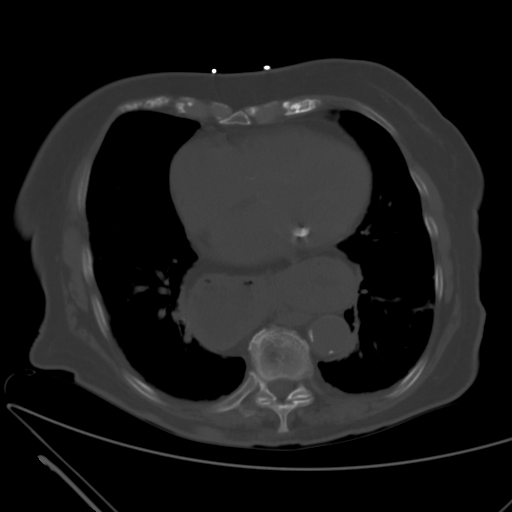

[Series 203: coronals, idose (2) · coronal · 0.45mm/px · 3 of 84 slices shown]
[im 28/84  soft-tissue]
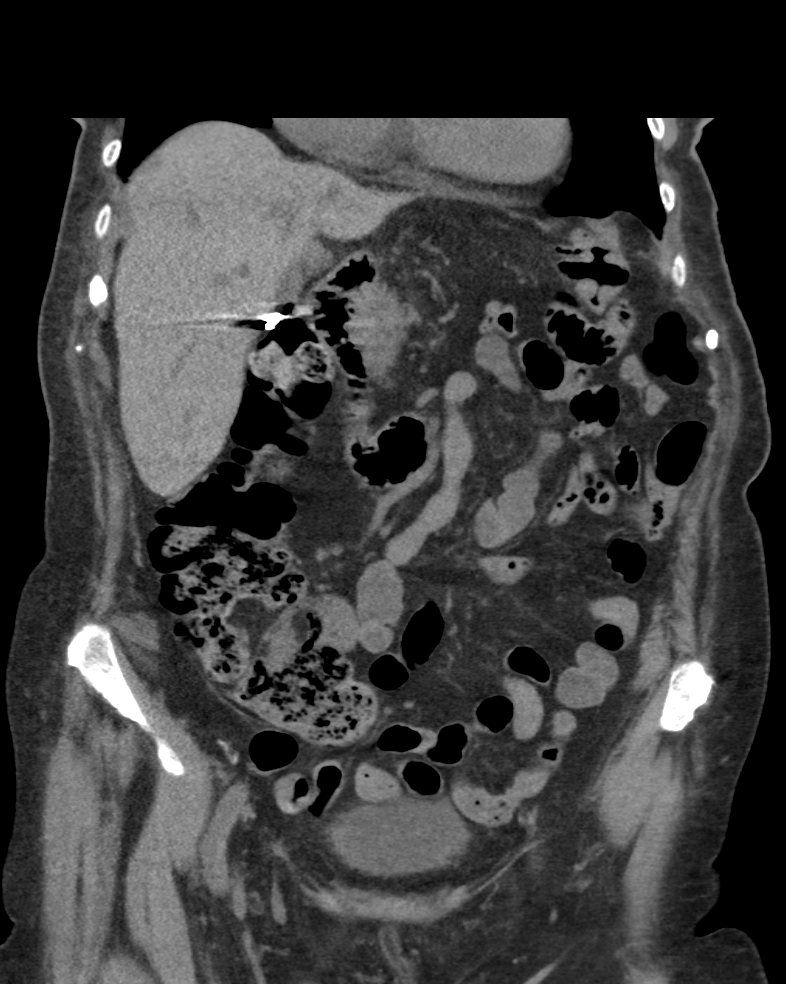
[im 37/84  soft-tissue]
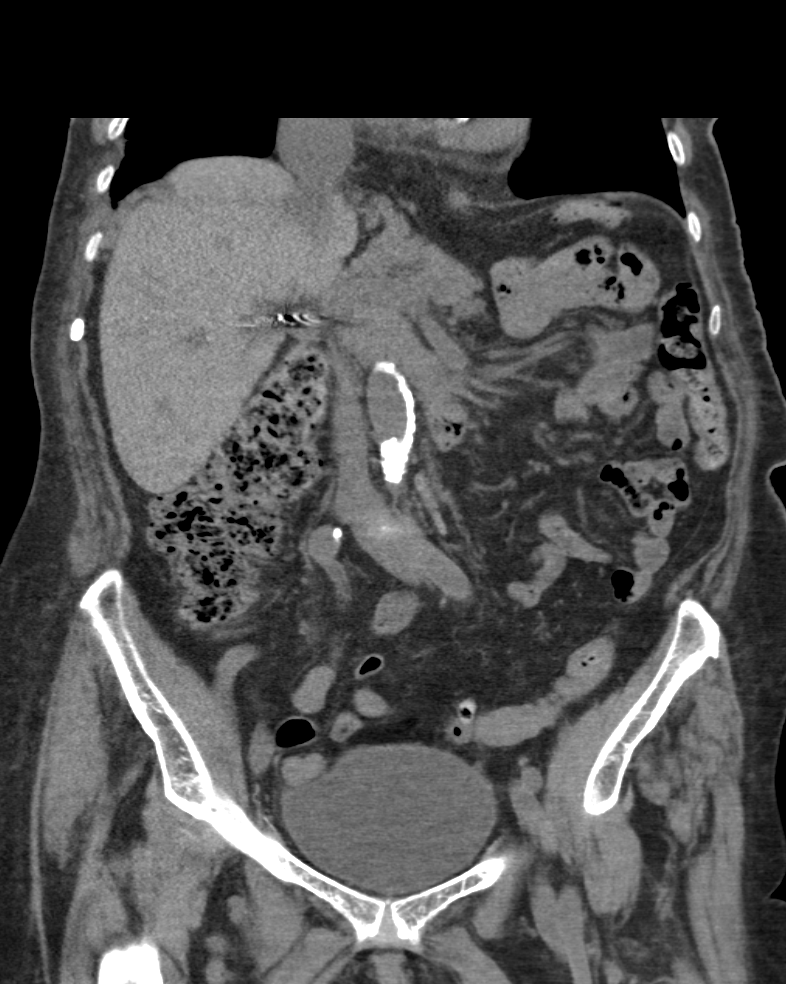
[im 47/84  soft-tissue]
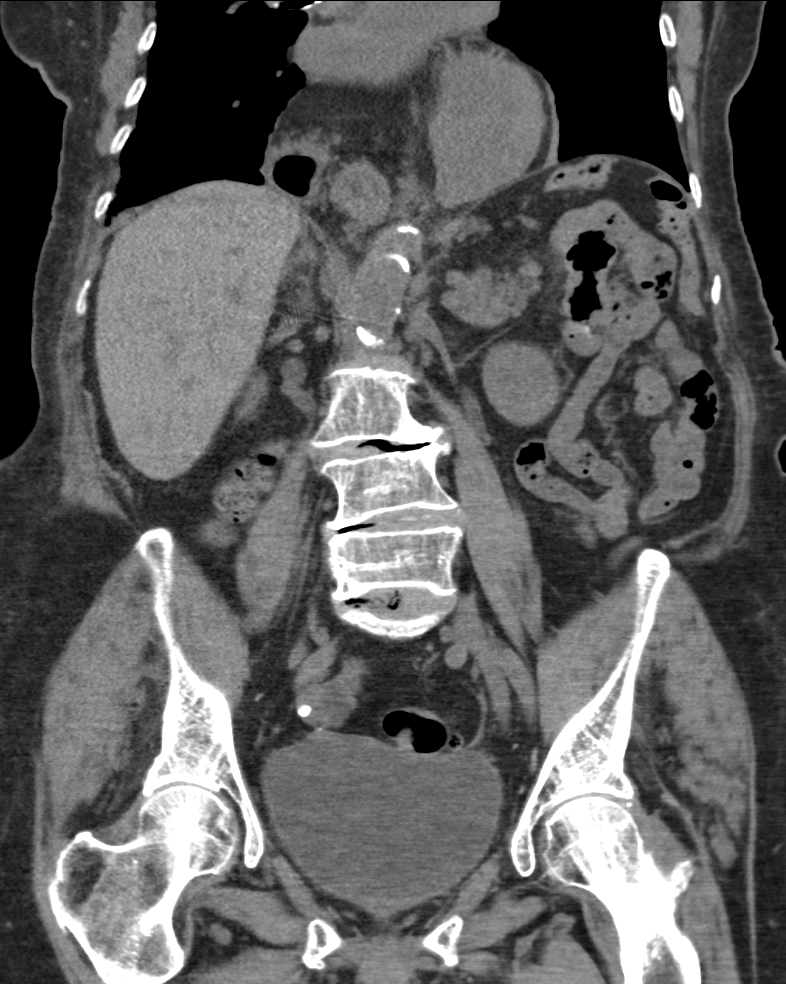

[12 of 46 positions shown; findings below may reference images not displayed]

FINDINGS: Multilevel degenerative disc disease is noted in the lumbar spine.
Old L1 compression fracture is noted. Visualized lung bases appear
normal. Large hiatal hernia is noted.

Left hepatic cyst is noted. Status post cholecystectomy. The spleen
and pancreas appear normal. Simple right renal cysts are noted. Mild
right renal atrophy is noted. Cortical scarring of upper pole of
left kidney is noted. No hydronephrosis or renal obstruction is
noted. Atherosclerotic calcifications of abdominal aorta are noted
without aneurysm formation. There is no evidence of bowel
obstruction. Stool is noted in the right colon. Minimal sigmoid
diverticulosis is noted without inflammation urinary bladder appears
normal. 3.9 cm right ovarian cyst is noted. Simple right renal cysts
are noted.
IMPRESSION: Large hiatal hernia is noted.

Bilateral nephrolithiasis is noted without hydronephrosis or renal
obstruction.

Mild sigmoid diverticulosis is noted without inflammation.

3.9 cm probable simple right ovarian cyst is noted. Followup
ultrasound in 1 year is recommended to ensure stability.
